# Patient Record
Sex: Male | Born: 1970
Health system: Southern US, Community
[De-identification: ages and names within clinical notes are randomized; demographics above are authoritative.]

---

## 1997-08-12 HISTORY — PX: VASECTOMY: SHX75

## 2007-06-16 ENCOUNTER — Ambulatory Visit: Payer: Self-pay | Admitting: General Surgery

## 2007-08-13 HISTORY — PX: HERNIA REPAIR: SHX51

## 2015-11-15 ENCOUNTER — Ambulatory Visit: Payer: Self-pay | Admitting: Primary Care

## 2015-11-21 ENCOUNTER — Ambulatory Visit (INDEPENDENT_AMBULATORY_CARE_PROVIDER_SITE_OTHER): Payer: 59 | Admitting: Primary Care

## 2015-11-21 ENCOUNTER — Encounter: Payer: Self-pay | Admitting: Primary Care

## 2015-11-21 VITALS — BP 104/80 | HR 57 | Temp 97.8°F | Ht 72.0 in | Wt 216.4 lb

## 2015-11-21 DIAGNOSIS — Z0000 Encounter for general adult medical examination without abnormal findings: Secondary | ICD-10-CM

## 2015-11-21 LAB — COMPREHENSIVE METABOLIC PANEL
ALT: 37 U/L (ref 0–53)
AST: 24 U/L (ref 0–37)
Albumin: 4.5 g/dL (ref 3.5–5.2)
Alkaline Phosphatase: 46 U/L (ref 39–117)
BILIRUBIN TOTAL: 0.8 mg/dL (ref 0.2–1.2)
BUN: 17 mg/dL (ref 6–23)
CALCIUM: 9.6 mg/dL (ref 8.4–10.5)
CO2: 29 mEq/L (ref 19–32)
CREATININE: 1.32 mg/dL (ref 0.40–1.50)
Chloride: 107 mEq/L (ref 96–112)
GFR: 62.76 mL/min (ref >60.00)
Glucose, Bld: 96 mg/dL (ref 70–99)
Potassium: 4.2 mEq/L (ref 3.5–5.1)
SODIUM: 140 meq/L (ref 135–145)
TOTAL PROTEIN: 7.3 g/dL (ref 6.0–8.3)

## 2015-11-21 LAB — CBC
HCT: 42.7 % (ref 39.0–52.0)
Hemoglobin: 14.6 g/dL (ref 13.0–17.0)
MCHC: 34.2 g/dL (ref 30.0–36.0)
MCV: 91.4 fl (ref 78.0–100.0)
Platelets: 197 10*3/uL (ref 150.0–400.0)
RBC: 4.67 Mil/uL (ref 4.22–5.81)
RDW: 12.8 % (ref 11.5–15.5)
WBC: 7.2 10*3/uL (ref 4.0–10.5)

## 2015-11-21 LAB — LIPID PANEL
CHOLESTEROL: 181 mg/dL (ref 0–200)
HDL: 35.7 mg/dL — ABNORMAL LOW (ref >39.00)
LDL Cholesterol: 126 mg/dL — ABNORMAL HIGH (ref 0–99)
NONHDL: 145.75
Total CHOL/HDL Ratio: 5
Triglycerides: 99 mg/dL (ref 0.0–149.0)
VLDL: 19.8 mg/dL (ref 0.0–40.0)

## 2015-11-21 LAB — HEMOGLOBIN A1C: HEMOGLOBIN A1C: 5.7 % (ref 4.6–6.5)

## 2015-11-21 NOTE — Progress Notes (Signed)
Subjective:    Patient ID: Eric Bolton, male    DOB: 24-Feb-1971, 45 y.o.   MRN: [id number]  HPI  [name] is a 45 year old male who presents today to establish care and for complete physical. He has forms to complete for an occupational physical including lab work.   Immunizations: -Tetanus: Unsure, believes he had a tetanus vaccination 2-3 years ago.  -Influenza: Did receive last season.    Diet: Endorses a fair. Breakfast: Skips  Lunch: Vegetables, meats, salads Dinner: Fast food Snacks: None Desserts: None Beverages: Water, flavored water with caffeine   Exercise: He runs and will do weights 4 times weekly.  Eye exam: Completed in numerous years. Denies changes in vision. Dental exam: Completes annually    Review of Systems  Constitutional: Negative for unexpected weight change.  HENT: Negative for rhinorrhea.   Respiratory: Negative for cough and shortness of breath.   Cardiovascular: Negative for chest pain.  Gastrointestinal: Negative for diarrhea and constipation.  Genitourinary: Negative for difficulty urinating.  Musculoskeletal: Negative for myalgias and arthralgias.  Skin: Negative for rash.  Allergic/Immunologic: Negative for environmental allergies.  Neurological: Negative for dizziness, numbness and headaches.  Psychiatric/Behavioral:       Denies concerns for anxiety or depression       History reviewed. No pertinent past medical history.  Social History   Social History  . Marital Status: Married    Spouse Name: N/A  . Number of Children: N/A  . Years of Education: N/A   Occupational History  . Not on file.   Social History Main Topics  . Smoking status: Never Smoker   . Smokeless tobacco: Not on file  . Alcohol Use: Yes  . Drug Use: Not on file  . Sexual Activity: Not on file   Other Topics Concern  . Not on file   Social History Narrative   Married.   3 children.    Works as a MidwifeDeputy Sheriff   Enjoys sports     Past Surgical History  Procedure Laterality Date  . Hernia repair  2009  . Vasectomy  1999    History reviewed. No pertinent family history.  No Known Allergies  No current outpatient prescriptions on file prior to visit.   No current facility-administered medications on file prior to visit.    BP 104/80 mmHg  Pulse 57  Temp(Src) 97.8 F (36.6 C)  Ht 6' (1.829 m)  Wt 216 lb 6.4 oz (98.158 kg)  BMI 29.34 kg/m2  SpO2 97%    Objective:   Physical Exam  Constitutional: He is oriented to person, place, and time. He appears well-nourished.  HENT:  Right Ear: Tympanic membrane and ear canal normal.  Left Ear: Tympanic membrane and ear canal normal.  Nose: Nose normal. Right sinus exhibits no maxillary sinus tenderness and no frontal sinus tenderness. Left sinus exhibits no maxillary sinus tenderness and no frontal sinus tenderness.  Mouth/Throat: Oropharynx is clear and moist.  Eyes: Conjunctivae and EOM are normal. Pupils are equal, round, and reactive to light.  Neck: Neck supple. Carotid bruit is not present. No thyromegaly present.  Cardiovascular: Normal rate, regular rhythm and normal heart sounds.   Pulmonary/Chest: Effort normal and breath sounds normal. He has no wheezes. He has no rales.  Abdominal: Soft. Bowel sounds are normal. There is no tenderness.  Musculoskeletal: Normal range of motion.  Neurological: He is alert and oriented to person, place, and time. He has normal reflexes. No cranial nerve deficit.  Skin: Skin is warm and dry.  Psychiatric: He has a normal mood and affect.          Assessment & Plan:

## 2015-11-21 NOTE — Progress Notes (Signed)
Pre visit review using our clinic review tool, if applicable. No additional management support is needed unless otherwise documented below in the visit note. 

## 2015-11-21 NOTE — Patient Instructions (Signed)
Complete lab work prior to leaving today. I will notify you of your results once received.   Work to [employer] Financialimprove your diet by limiting fast foods, fried foods, fatty foods.   Limit consumption of caffeine.  Ensure you are drinking 64 ounces of water daily.   I recommend an annual eye examination.  Follow up in 1 year for repeat physical or sooner of needed.  It was a pleasure to meet you today! Please don't hesitate to call me with any questions. Welcome to [facility]!

## 2015-11-21 NOTE — Assessment & Plan Note (Signed)
Td and Flu UTD. Discussed the importance of a healthy diet and regular exercise in order for weight loss and to reduce risk of other medical diseases. He does exercise 4 days weekly, commended him on this. Exam unremarkable. Labs pending.  Follow up in 1 year for repeat physical.

## 2015-11-22 ENCOUNTER — Encounter: Payer: Self-pay | Admitting: *Deleted

## 2016-09-28 ENCOUNTER — Emergency Department (HOSPITAL_COMMUNITY): Payer: 59

## 2016-09-28 ENCOUNTER — Encounter (HOSPITAL_COMMUNITY): Payer: Self-pay

## 2016-09-28 ENCOUNTER — Emergency Department (HOSPITAL_COMMUNITY)
Admission: EM | Admit: 2016-09-28 | Discharge: 2016-09-29 | Disposition: A | Discharge status: Home / Self Care | Payer: 59 | Attending: Emergency Medicine | Admitting: Emergency Medicine

## 2016-09-28 DIAGNOSIS — S0101XA Laceration without foreign body of scalp, initial encounter: Secondary | ICD-10-CM | POA: Insufficient documentation

## 2016-09-28 DIAGNOSIS — S0990XA Unspecified injury of head, initial encounter: Secondary | ICD-10-CM | POA: Diagnosis present

## 2016-09-28 DIAGNOSIS — W108XXA Fall (on) (from) other stairs and steps, initial encounter: Secondary | ICD-10-CM | POA: Diagnosis not present

## 2016-09-28 DIAGNOSIS — Y929 Unspecified place or not applicable: Secondary | ICD-10-CM | POA: Insufficient documentation

## 2016-09-28 DIAGNOSIS — Y999 Unspecified external cause status: Secondary | ICD-10-CM | POA: Diagnosis not present

## 2016-09-28 DIAGNOSIS — F10120 Alcohol abuse with intoxication, uncomplicated: Secondary | ICD-10-CM | POA: Diagnosis not present

## 2016-09-28 DIAGNOSIS — Y939 Activity, unspecified: Secondary | ICD-10-CM | POA: Insufficient documentation

## 2016-09-28 DIAGNOSIS — F10920 Alcohol use, unspecified with intoxication, uncomplicated: Secondary | ICD-10-CM

## 2016-09-28 LAB — CBC WITH DIFFERENTIAL/PLATELET
Basophils Absolute: 0 10*3/uL (ref 0.0–0.1)
Basophils Relative: 0 %
Eosinophils Absolute: 0.1 10*3/uL (ref 0.0–0.7)
Eosinophils Relative: 1 %
HEMATOCRIT: 40.4 % (ref 39.0–52.0)
HEMOGLOBIN: 14.1 g/dL (ref 13.0–17.0)
LYMPHS ABS: 5.2 10*3/uL — AB (ref 0.7–4.0)
LYMPHS PCT: 51 %
MCH: 31 pg (ref 26.0–34.0)
MCHC: 34.9 g/dL (ref 30.0–36.0)
MCV: 88.8 fL (ref 78.0–100.0)
MONOS PCT: 9 %
Monocytes Absolute: 0.9 10*3/uL (ref 0.1–1.0)
NEUTROS PCT: 39 %
Neutro Abs: 4 10*3/uL (ref 1.7–7.7)
Platelets: 197 10*3/uL (ref 150–400)
RBC: 4.55 MIL/uL (ref 4.22–5.81)
RDW: 12.4 % (ref 11.5–15.5)
WBC: 10.1 10*3/uL (ref 4.0–10.5)

## 2016-09-28 LAB — BASIC METABOLIC PANEL
Anion gap: 14 (ref 5–15)
BUN: 14 mg/dL (ref 6–20)
CHLORIDE: 104 mmol/L (ref 101–111)
CO2: 21 mmol/L — AB (ref 22–32)
CREATININE: 1.41 mg/dL — AB (ref 0.61–1.24)
Calcium: 9 mg/dL (ref 8.9–10.3)
GFR calc Af Amer: >60 mL/min (ref >60)
GFR calc non Af Amer: 59 mL/min — ABNORMAL LOW (ref >60)
GLUCOSE: 117 mg/dL — AB (ref 65–99)
POTASSIUM: 3.4 mmol/L — AB (ref 3.5–5.1)
Sodium: 139 mmol/L (ref 135–145)

## 2016-09-28 LAB — ETHANOL: Alcohol, Ethyl (B): 228 mg/dL — ABNORMAL HIGH (ref <5)

## 2016-09-28 NOTE — ED Provider Notes (Signed)
[facility]-EMERGENCY DEPT Provider Note   CSN: [id number] Arrival date & time: 09/28/16  2237     History   Chief Complaint Chief Complaint  Patient presents with  . Fall  . Head Injury    HPI Eric Bolton is a 46 y.o. male.  He fell down about 10 steps suffering a laceration to his scalp. He denies loss of consciousness. He has been drinking heavily tonight. He denies other injury. There's been no nausea or vomiting. He was ambulatory at the scene. 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   The history is provided by the patient.    History reviewed. No pertinent past medical history.  Patient Active Problem List   Diagnosis Date Noted  . Preventative health care 11/21/2015    Past Surgical History:  Procedure Laterality Date  . HERNIA REPAIR  2009  . VASECTOMY  1999       Home Medications    Prior to Admission medications   Not on File    Family History History reviewed. No pertinent family history.  Social History Social History  Substance Use Topics  . Smoking status: Never Smoker  . Smokeless tobacco: Never Used  . Alcohol use Yes     Comment: once a week     Allergies   Patient has no known allergies.   Review of Systems Review of Systems  All other systems reviewed and are negative.    Physical Exam Updated Vital Signs BP 114/82 (BP Location: Right Arm)   Pulse 68   Temp 98.2 F (36.8 C) (Oral)   Resp 18   Ht 6\' 2"  (1.88 m)   Wt 210 lb (95.3 kg)   SpO2 95%   BMI 26.96 kg/m   Physical Exam  Nursing note and vitals reviewed.  [age] year old male, resting comfortably and in no acute distress. Vital signs are normal. Oxygen saturation is 95%, which is normal. Head is normocephalic. Large laceration present at the vertex of the scalp on the right side. PERRLA, EOMI. Oropharynx is clear. Neck is immobilized in a stiff cervical collar. Back is nontender and there  is no CVA tenderness. Lungs are clear without rales, wheezes, or rhonchi. Chest is nontender. Heart has regular rate and rhythm without murmur. Abdomen is soft, flat, nontender without masses or hepatosplenomegaly and peristalsis is normoactive. Extremities have no cyanosis or edema, full range of motion is present. Skin is warm and dry without rash. Neurologic: Mental status is normal, cranial nerves are intact, there are no motor or sensory deficits.  ED Treatments / Results  Labs (all labs ordered are listed, but only abnormal results are displayed) Labs Reviewed  CBC WITH DIFFERENTIAL/PLATELET - Abnormal; Notable for the following:       Result Value   Lymphs Abs 5.2 (*)    All other components within normal limits  BASIC METABOLIC PANEL - Abnormal; Notable for the following:    Potassium 3.4 (*)    CO2 21 (*)    Glucose, Bld 117 (*)    Creatinine, Ser 1.41 (*)    GFR calc non Af Amer 59 (*)    All other components within normal limits  ETHANOL - Abnormal; Notable for the following:    Alcohol, Ethyl (B) 228 (*)    All other components within normal limits   Radiology Ct Head Wo Contrast  Result Date: 09/29/2016 CLINICAL DATA:  Fell 10 feet. Laceration to the  top of the skull at the parietal region. No loss of consciousness. Intoxicated. EXAM: CT HEAD WITHOUT CONTRAST CT CERVICAL SPINE WITHOUT CONTRAST TECHNIQUE: Multidetector CT imaging of the head and cervical spine was performed following the standard protocol without intravenous contrast. Multiplanar CT image reconstructions of the cervical spine were also generated. COMPARISON:  None. FINDINGS: CT HEAD FINDINGS Brain: No evidence of acute infarction, hemorrhage, hydrocephalus, extra-axial collection or mass lesion/mass effect. Vascular: No hyperdense vessel or unexpected calcification. Skull: Calvarium appears intact.  No depressed skull fractures. Sinuses/Orbits: No acute finding. Other: Soft tissue scalp laceration over the  right parietal region. Laceration extends to the bone surface. Associated subcutaneous emphysema. No large hematomas. CT CERVICAL SPINE FINDINGS Alignment: Normal alignment of the cervical vertebrae and facet joints. C1-2 articulation appears intact. Skull base and vertebrae: Skullbase is unremarkable. No vertebral compression deformities. No focal bone lesion or bone destruction. Bone cortex appears intact. Soft tissues and spinal canal: No prevertebral fluid or swelling. No visible canal hematoma. Disc levels: Mild degenerative changes at C5-6 with slight disc space narrowing and endplate hypertrophic changes. Upper chest: Negative. Other: None. IMPRESSION: No acute intracranial abnormalities. Subcutaneous scalp laceration over the right parietal region. Normal alignment of the cervical spine. Minimal degenerative changes. No acute displaced fractures identified. Electronically Signed   By: Burman Nieves M.D.   On: 09/29/2016 00:04   Ct Cervical Spine Wo Contrast  Result Date: 09/29/2016 CLINICAL DATA:  Fell 10 feet. Laceration to the top of the skull at the parietal region. No loss of consciousness. Intoxicated. EXAM: CT HEAD WITHOUT CONTRAST CT CERVICAL SPINE WITHOUT CONTRAST TECHNIQUE: Multidetector CT imaging of the head and cervical spine was performed following the standard protocol without intravenous contrast. Multiplanar CT image reconstructions of the cervical spine were also generated. COMPARISON:  None. FINDINGS: CT HEAD FINDINGS Brain: No evidence of acute infarction, hemorrhage, hydrocephalus, extra-axial collection or mass lesion/mass effect. Vascular: No hyperdense vessel or unexpected calcification. Skull: Calvarium appears intact.  No depressed skull fractures. Sinuses/Orbits: No acute finding. Other: Soft tissue scalp laceration over the right parietal region. Laceration extends to the bone surface. Associated subcutaneous emphysema. No large hematomas. CT CERVICAL SPINE FINDINGS  Alignment: Normal alignment of the cervical vertebrae and facet joints. C1-2 articulation appears intact. Skull base and vertebrae: Skullbase is unremarkable. No vertebral compression deformities. No focal bone lesion or bone destruction. Bone cortex appears intact. Soft tissues and spinal canal: No prevertebral fluid or swelling. No visible canal hematoma. Disc levels: Mild degenerative changes at C5-6 with slight disc space narrowing and endplate hypertrophic changes. Upper chest: Negative. Other: None. IMPRESSION: No acute intracranial abnormalities. Subcutaneous scalp laceration over the right parietal region. Normal alignment of the cervical spine. Minimal degenerative changes. No acute displaced fractures identified. Electronically Signed   By: Burman Nieves M.D.   On: 09/29/2016 00:04    Procedures Procedures (including critical care time) LACERATION REPAIR Performed by: UXLKG,[email or web address] Authorized by: VOZDG,UYQIH Consent: Verbal consent obtained. Risks and benefits: risks, benefits and alternatives were discussed Consent given by: patient Patient identity confirmed: provided demographic data Prepped and Draped in normal sterile fashion Wound explored  Laceration Location: Scalp  Laceration Length: 14 cm  No Foreign Bodies seen or palpated  Anesthesia:None  Amount of cleaning: standard  Skin closure: Close   Number of staples: 16  Technique: Surgical stapling   Patient tolerance: Patient tolerated the procedure well with no immediate complications.   Medications Ordered in ED Medications - No data to display   Initial  Impression / Assessment and Plan / ED Course  I have reviewed the triage vital signs and the nursing notes.  Pertinent labs & imaging results that were available during my care of the patient were reviewed by me and considered in my medical decision making (see chart for details).  Fall with scalp laceration. He is being sent for CT scans of head and  cervical spine. He will need staple closure of laceration.  CT of head and cervical spine show no intracranial injury and no bony injury. Laceration is closed and he is discharged. Of note, ethanol level is significantly elevated at 228.  Final Clinical Impressions(s) / ED Diagnoses   Final diagnoses:  Fall down stairs, initial encounter  Alcohol intoxication, uncomplicated (HCC)  Scalp laceration, initial encounter    New Prescriptions New Prescriptions   No medications on file     Dione Booze, MD 09/29/16 [phone]

## 2016-09-28 NOTE — ED Triage Notes (Signed)
Pt from home, pt was walking down steps and fell 10 feet and hit head. Pt has deep laceration to right side of the top of the crown. Pt alert and oriented x 4. Admits to etoh use this evening. PERRL. Pt denies pain.

## 2016-09-29 NOTE — ED Notes (Signed)
Pt and family understood dc material. NAD Noted 

## 2016-10-07 ENCOUNTER — Encounter: Payer: Self-pay | Admitting: Primary Care

## 2016-10-07 ENCOUNTER — Ambulatory Visit (INDEPENDENT_AMBULATORY_CARE_PROVIDER_SITE_OTHER): Payer: 59 | Admitting: Primary Care

## 2016-10-07 VITALS — BP 116/80 | HR 63 | Temp 97.6°F | Ht 74.0 in | Wt 214.8 lb

## 2016-10-07 DIAGNOSIS — Z4802 Encounter for removal of sutures: Secondary | ICD-10-CM

## 2016-10-07 NOTE — Patient Instructions (Signed)
Try Mederma cream to prevent scaring. You may also try coconut oil.  Continue to keep the site clean.   Please call me if your incision becomes very red, painful, and starts oozing.  It was a pleasure to see you today!

## 2016-10-07 NOTE — Progress Notes (Signed)
Pre visit review using our clinic review tool, if applicable. No additional management support is needed unless otherwise documented below in the visit note. 

## 2016-10-07 NOTE — Progress Notes (Signed)
   Subjective:    Patient ID: Eric Bolton, male    DOB: 12/02/1970, 46 y.o.   MRN: [id number]  HPI  [name] is a 46 year old male who presents today for staple removal.   He presented to the emergency department on 09/28/16 with a chief complaint of laceration. He fell down 10 steps suffering a laceration to his scalp. During his ED stay he underwent CT of his head and cervical spine which did not show any acute intercranial abnormalities.   During his stay in the ED he had 16 stables placed to his right scalp. He denies fevers, erythema, pain, drainage. He's keeping his wound clean and has been applying Neosporin ointment. He is here today to have staples removed.  Review of Systems  Constitutional: Negative for chills and fever.  Skin: Positive for wound. Negative for color change.       No past medical history on file.   Social History   Social History  . Marital status: Married    Spouse name: N/A  . Number of children: N/A  . Years of education: N/A   Occupational History  . Not on file.   Social History Main Topics  . Smoking status: Never Smoker  . Smokeless tobacco: Never Used  . Alcohol use Yes     Comment: once a week  . Drug use: No  . Sexual activity: Not on file   Other Topics Concern  . Not on file   Social History Narrative   Married.   3 children.    Works as a MidwifeDeputy Sheriff   Enjoys sports    Past Surgical History:  Procedure Laterality Date  . HERNIA REPAIR  2009  . VASECTOMY  1999    No family history on file.  No Known Allergies  No current outpatient prescriptions on file prior to visit.   No current facility-administered medications on file prior to visit.     BP 116/80   Pulse 63   Temp 97.6 F (36.4 C) (Oral)   Ht 6\' 2"  (1.88 m)   Wt 214 lb 12.8 oz (97.4 kg)   SpO2 96%   BMI 27.58 kg/m    Objective:   Physical Exam  Neck: Neck supple.  Cardiovascular: Normal rate.   Pulmonary/Chest: Effort normal.    Skin: Skin is warm and dry.  14 cm laceration to right scalp, scabbing, no drainage, no erythema, non tender.          Assessment & Plan:  Staple Removal:  Presents today for staple removal. 12 days since staples were applied. Site cleansed. 16 staples removed from closed laceration to right head. Applied bacitracin ointment to site. No s/s of acute infection. Patient tolerated well. Home care instructions discussed.  Morrie Sheldonlark,Katherine Kendal, NP

## 2016-11-18 ENCOUNTER — Other Ambulatory Visit (INDEPENDENT_AMBULATORY_CARE_PROVIDER_SITE_OTHER): Payer: 59

## 2016-11-18 ENCOUNTER — Other Ambulatory Visit: Payer: Self-pay | Admitting: Primary Care

## 2016-11-18 DIAGNOSIS — R7303 Prediabetes: Secondary | ICD-10-CM | POA: Diagnosis not present

## 2016-11-18 DIAGNOSIS — E785 Hyperlipidemia, unspecified: Secondary | ICD-10-CM

## 2016-11-18 LAB — LIPID PANEL
CHOL/HDL RATIO: 5
CHOLESTEROL: 200 mg/dL (ref 0–200)
HDL: 39 mg/dL — ABNORMAL LOW (ref >39.00)
NonHDL: 161.14
Triglycerides: 212 mg/dL — ABNORMAL HIGH (ref 0.0–149.0)
VLDL: 42.4 mg/dL — ABNORMAL HIGH (ref 0.0–40.0)

## 2016-11-18 LAB — COMPREHENSIVE METABOLIC PANEL
ALBUMIN: 4.6 g/dL (ref 3.5–5.2)
ALT: 26 U/L (ref 0–53)
AST: 23 U/L (ref 0–37)
Alkaline Phosphatase: 52 U/L (ref 39–117)
BILIRUBIN TOTAL: 0.6 mg/dL (ref 0.2–1.2)
BUN: 31 mg/dL — AB (ref 6–23)
CALCIUM: 9.9 mg/dL (ref 8.4–10.5)
CO2: 30 mEq/L (ref 19–32)
CREATININE: 1.45 mg/dL (ref 0.40–1.50)
Chloride: 103 mEq/L (ref 96–112)
GFR: 56.05 mL/min — ABNORMAL LOW (ref >60.00)
Glucose, Bld: 80 mg/dL (ref 70–99)
Potassium: 4.1 mEq/L (ref 3.5–5.1)
SODIUM: 139 meq/L (ref 135–145)
Total Protein: 7.2 g/dL (ref 6.0–8.3)

## 2016-11-18 LAB — HEMOGLOBIN A1C: HEMOGLOBIN A1C: 5.4 % (ref 4.6–6.5)

## 2016-11-18 LAB — LDL CHOLESTEROL, DIRECT: LDL DIRECT: 131 mg/dL

## 2016-11-21 ENCOUNTER — Encounter: Payer: Self-pay | Admitting: Primary Care

## 2016-11-21 ENCOUNTER — Encounter (INDEPENDENT_AMBULATORY_CARE_PROVIDER_SITE_OTHER): Payer: Self-pay

## 2016-11-21 ENCOUNTER — Ambulatory Visit (INDEPENDENT_AMBULATORY_CARE_PROVIDER_SITE_OTHER): Payer: 59 | Admitting: Primary Care

## 2016-11-21 VITALS — BP 116/78 | HR 50 | Temp 98.2°F | Ht 74.0 in | Wt 211.8 lb

## 2016-11-21 DIAGNOSIS — E785 Hyperlipidemia, unspecified: Secondary | ICD-10-CM | POA: Diagnosis not present

## 2016-11-21 DIAGNOSIS — N289 Disorder of kidney and ureter, unspecified: Secondary | ICD-10-CM | POA: Diagnosis not present

## 2016-11-21 DIAGNOSIS — Z0000 Encounter for general adult medical examination without abnormal findings: Secondary | ICD-10-CM

## 2016-11-21 NOTE — Assessment & Plan Note (Signed)
Slight decrease in GFR. Discussed to decrease caffeine products and increase water consumption. Recheck in 6 months.

## 2016-11-21 NOTE — Patient Instructions (Signed)
Your cholesterol is slightly elevated when compared to last year. Increase consumption of vegetables, fruit, whole grains.   Continue exercising. You should be getting 150 minutes of moderate intensity exercise weekly.  Your kidney function is slightly declined. This is stable when compared to last year. Reduce caffeine products and avoid medications like Ibuprofen, Aleve, etc.  Schedule a lab only appointment in 6 months for re-evaluation of your cholesterol and kidney function. Ensure you come fasting 4 hours prior to this appointment.   Follow up in 1 year for your annual exam or sooner if needed.  It was a pleasure to see you today!

## 2016-11-21 NOTE — Progress Notes (Signed)
Pre visit review using our clinic review tool, if applicable. No additional management support is needed unless otherwise documented below in the visit note. 

## 2016-11-21 NOTE — Progress Notes (Signed)
Subjective:    Patient ID: Nyra Jabs, male    DOB: [birth year], 46 y.o.   MRN: [id number]  HPI  Mr. Grassia is a 46 year old male who presents today for complete physical.  Immunizations: -Tetanus: Completed 3-4 years ago.  -Influenza: Did not completed last season.   Diet: He endorses a healthy diet that he started in January 2018 Breakfast: Skips Lunch: Tuna, peanut butter sandwich  Dinner: Chicken, fruit, peanut butter Snacks: Boiled eggs Desserts: None Beverages: Water, energy packets, pre-work out mix  Exercise: Runs and lifts weights Eye exam: Completed several years ago Dental exam: Completes annually   Review of Systems  Constitutional: Negative for unexpected weight change.  HENT: Negative for rhinorrhea.   Respiratory: Negative for cough and shortness of breath.   Cardiovascular: Negative for chest pain.  Gastrointestinal: Negative for constipation and diarrhea.  Genitourinary: Negative for difficulty urinating.  Musculoskeletal: Negative for arthralgias and myalgias.  Skin: Negative for rash.  Allergic/Immunologic: Negative for environmental allergies.  Neurological: Negative for dizziness, numbness and headaches.  Psychiatric/Behavioral:       Denies concerns for anxiety or depression       No past medical history on file.   Social History   Social History  . Marital status: Married    Spouse name: N/A  . Number of children: N/A  . Years of education: N/A   Occupational History  . Not on file.   Social History Main Topics  . Smoking status: Never Smoker  . Smokeless tobacco: Never Used  . Alcohol use Yes     Comment: once a week  . Drug use: No  . Sexual activity: Not on file   Other Topics Concern  . Not on file   Social History Narrative   Married.   3 children.    Works as a Midwife   Enjoys sports    Past Surgical History:  Procedure Laterality Date  . HERNIA REPAIR  2009  . VASECTOMY  1999    No family  history on file.  No Known Allergies  No current outpatient prescriptions on file prior to visit.   No current facility-administered medications on file prior to visit.     BP 116/78   Pulse (!) 50   Temp 98.2 F (36.8 C) (Oral)   Ht  (1.88 m)   Wt 211 lb 12.8 oz (96.1 kg)   SpO2 98%   BMI 27.19 kg/m    Objective:   Physical Exam  Constitutional: He is oriented to person, place, and time. He appears well-nourished.  HENT:  Right Ear: Tympanic membrane and ear canal normal.  Left Ear: Tympanic membrane and ear canal normal.  Nose: Nose normal. Right sinus exhibits no maxillary sinus tenderness and no frontal sinus tenderness. Left sinus exhibits no maxillary sinus tenderness and no frontal sinus tenderness.  Mouth/Throat: Oropharynx is clear and moist.  Eyes: Conjunctivae and EOM are normal. Pupils are equal, round, and reactive to light.  Neck: Neck supple. Carotid bruit is not present. No thyromegaly present.  Cardiovascular: Normal rate, regular rhythm and normal heart sounds.   Pulmonary/Chest: Effort normal and breath sounds normal. He has no wheezes. He has no rales.  Abdominal: Soft. Bowel sounds are normal. There is no tenderness.  Musculoskeletal: Normal range of motion.  Neurological: He is alert and oriented to person, place, and time. He has normal reflexes. No cranial nerve deficit.  Skin: Skin is warm and dry.  Well healing  scar to right cranium  Psychiatric: He has a normal mood and affect.          Assessment & Plan:

## 2016-11-21 NOTE — Assessment & Plan Note (Signed)
Immunizations UTD. Commended him on regular exercise and fair diet. Discussed to reduce caffeine products and increase vegetables, fruit, whole grains. Exam unremarkable. Labs with decreased renal function and hyperlipidemia. Repeat labs in 6 months. Follow up in 1 year for annual exam.

## 2016-11-21 NOTE — Assessment & Plan Note (Signed)
TC borderline, Trigs and LDL above goal. Discussed to increase vegetables, fruit, whole grains. Continue regular exercise. Recheck Lipids in 6 months.

## 2017-05-14 ENCOUNTER — Other Ambulatory Visit: Payer: Self-pay | Admitting: Primary Care

## 2017-05-14 DIAGNOSIS — E785 Hyperlipidemia, unspecified: Secondary | ICD-10-CM

## 2017-05-14 DIAGNOSIS — N289 Disorder of kidney and ureter, unspecified: Secondary | ICD-10-CM

## 2017-05-23 ENCOUNTER — Other Ambulatory Visit: Payer: 59

## 2017-11-13 ENCOUNTER — Other Ambulatory Visit: Payer: Self-pay | Admitting: Primary Care

## 2017-11-13 DIAGNOSIS — E782 Mixed hyperlipidemia: Secondary | ICD-10-CM

## 2017-11-20 ENCOUNTER — Other Ambulatory Visit: Payer: Self-pay

## 2017-11-27 ENCOUNTER — Encounter: Payer: 59 | Admitting: Primary Care

## 2018-06-11 ENCOUNTER — Emergency Department (HOSPITAL_COMMUNITY): Admission: EM | Admit: 2018-06-11 | Discharge: 2018-06-11 | Discharge status: Absent without leave | Payer: Self-pay

## 2018-06-17 DIAGNOSIS — M7711 Lateral epicondylitis, right elbow: Secondary | ICD-10-CM | POA: Diagnosis not present

## 2018-08-12 DIAGNOSIS — I82409 Acute embolism and thrombosis of unspecified deep veins of unspecified lower extremity: Secondary | ICD-10-CM

## 2018-08-12 HISTORY — DX: Acute embolism and thrombosis of unspecified deep veins of unspecified lower extremity: I82.409

## 2018-08-20 DIAGNOSIS — Z713 Dietary counseling and surveillance: Secondary | ICD-10-CM | POA: Diagnosis not present

## 2019-02-16 DIAGNOSIS — Z713 Dietary counseling and surveillance: Secondary | ICD-10-CM | POA: Diagnosis not present

## 2019-04-13 DIAGNOSIS — Z713 Dietary counseling and surveillance: Secondary | ICD-10-CM | POA: Diagnosis not present

## 2019-04-26 DIAGNOSIS — M25571 Pain in right ankle and joints of right foot: Secondary | ICD-10-CM | POA: Diagnosis not present

## 2019-05-17 ENCOUNTER — Other Ambulatory Visit: Payer: Self-pay

## 2019-05-17 ENCOUNTER — Ambulatory Visit: Payer: BC Managed Care – PPO | Admitting: Primary Care

## 2019-05-17 ENCOUNTER — Encounter: Payer: Self-pay | Admitting: Primary Care

## 2019-05-17 DIAGNOSIS — R600 Localized edema: Secondary | ICD-10-CM | POA: Insufficient documentation

## 2019-05-17 DIAGNOSIS — M25473 Effusion, unspecified ankle: Secondary | ICD-10-CM | POA: Diagnosis not present

## 2019-05-17 LAB — COMPREHENSIVE METABOLIC PANEL
ALT: 38 U/L (ref 0–53)
AST: 28 U/L (ref 0–37)
Albumin: 4.7 g/dL (ref 3.5–5.2)
Alkaline Phosphatase: 55 U/L (ref 39–117)
BUN: 22 mg/dL (ref 6–23)
CO2: 27 mEq/L (ref 19–32)
Calcium: 9.8 mg/dL (ref 8.4–10.5)
Chloride: 104 mEq/L (ref 96–112)
Creatinine, Ser: 1.28 mg/dL (ref 0.40–1.50)
GFR: 60.23 mL/min (ref >60.00)
Glucose, Bld: 90 mg/dL (ref 70–99)
Potassium: 4.5 mEq/L (ref 3.5–5.1)
Sodium: 137 mEq/L (ref 135–145)
Total Bilirubin: 0.7 mg/dL (ref 0.2–1.2)
Total Protein: 7.3 g/dL (ref 6.0–8.3)

## 2019-05-17 LAB — CBC
HCT: 42.3 % (ref 39.0–52.0)
Hemoglobin: 14.3 g/dL (ref 13.0–17.0)
MCHC: 33.8 g/dL (ref 30.0–36.0)
MCV: 92.4 fl (ref 78.0–100.0)
Platelets: 203 10*3/uL (ref 150.0–400.0)
RBC: 4.58 Mil/uL (ref 4.22–5.81)
RDW: 13.2 % (ref 11.5–15.5)
WBC: 6.6 10*3/uL (ref 4.0–10.5)

## 2019-05-17 LAB — URIC ACID: Uric Acid, Serum: 7.2 mg/dL (ref 4.0–7.8)

## 2019-05-17 NOTE — Assessment & Plan Note (Signed)
Diagnosed with gout per orthopedics, no labs completed.  Presentation and HPI today doesn't fit clinical picture of gout, but will check uric acid level today.  Suspect edema is secondary to constant walking during five days weekly with constant sitting two days weekly. Suspect pain is secondary to swelling.   Check labs today including uric acid, CMP, CBC. Recommended he purchase compression socks for work.   Await results.

## 2019-05-17 NOTE — Progress Notes (Signed)
Subjective:    Patient ID: Eric Bolton, male    DOB: [birth year], 48 y.o.   MRN: [id number]  HPI  Eric Bolton is a 48 year old male with a history of hyperlipidemia, decreased renal function who presents today with a chief complaint of foot swelling.   About one month ago he traveled to [place] and on his way back he noticed swelling to his right ankle. During that trip he ate a lot of shellfish and drank a lot of beer. Over the last 2-3 weeks he's noticed his usual swelling with pain.   He presented to [facility] several weeks ago and was diagnosed with gout and was treated with Mitagatree with temporary improvement but without resolve to swelling and pain. He did undergo xrays and was told that everything looked normal. Since treated he's continued to take his Mitagare twice daily and continues to notice pain.   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 Also walks 10 miles daily five days weekly at work. He does notice significant improvement in swelling and pain when waking in the morning, will progress during the day.  He denies injury/trauma, color changes.   BP Readings from Last 3 Encounters:  05/17/19 122/78  11/21/16 116/78  10/07/16 116/80     Review of Systems  Eyes: Negative for visual disturbance.  Respiratory: Negative for shortness of breath.   Cardiovascular: Positive for leg swelling. Negative for chest pain.  Musculoskeletal: Positive for arthralgias.       No past medical history on file.   Social History   Socioeconomic History  . Marital status: Married    Spouse name: Not on file  . Number of children: Not on file  . Years of education: Not on file  . Highest education level: Not on file  Occupational History  . Not on file  Social Needs  . Financial resource strain: Not on file  . Food insecurity    Worry: Not on file    Inability: Not on file  . Transportation needs    Medical: Not on file   Non-medical: Not on file  Tobacco Use  . Smoking status: Never Smoker  . Smokeless tobacco: Never Used  Substance and Sexual Activity  . Alcohol use: Yes    Comment: once a week  . Drug use: No  . Sexual activity: Not on file  Lifestyle  . Physical activity    Days per week: Not on file    Minutes per session: Not on file  . Stress: Not on file  Relationships  . Social Musician on phone: Not on file    Gets together: Not on file    Attends religious service: Not on file    Active member of club or organization: Not on file    Attends meetings of clubs or organizations: Not on file    Relationship status: Not on file  . Intimate partner violence    Fear of current or ex partner: Not on file    Emotionally abused: Not on file    Physically abused: Not on file    Forced sexual activity: Not on file  Other Topics Concern  . Not on file  Social History Narrative   Married.   3 children.    Works as a Midwife   Enjoys sports    Past Surgical History:  Procedure Laterality Date  . HERNIA REPAIR  2009  . VASECTOMY  1999    No family history on file.  No Known Allergies  Current Outpatient Medications on File Prior to Visit  Medication Sig Dispense Refill  . MITIGARE 0.6 MG CAPS Take 1 capsule by mouth 2 (two) times daily.      No current facility-administered medications on file prior to visit.     BP 122/78   Pulse (!) 55   Temp 97.7 F (36.5 C) (Temporal)   Ht 6\' 2"  (1.88 m)   Wt 213 lb 12 oz (97 kg)   SpO2 97%   BMI 27.44 kg/m    Objective:   Physical Exam  Constitutional: He appears well-nourished.  Neck: Neck supple.  Cardiovascular: Normal rate and regular rhythm.  Pulses:      Dorsalis pedis pulses are 2+ on the right side.       Posterior tibial pulses are 2+ on the right side.  Mild edema noted to right ankle, no pitting or erythema.  Left ankle benign.   Respiratory: Effort normal and breath sounds normal.  Skin: Skin is  warm and dry.  Psychiatric: He has a normal mood and affect.           Assessment & Plan:

## 2019-05-17 NOTE — Patient Instructions (Signed)
Stop by the lab prior to leaving today. I will notify you of your results once received.   Elevate your legs when resting.  Purchase compression socks to wear during the day.  Ensure you are consuming 64 ounces of water daily.  It was a pleasure to see you today!

## 2019-05-18 ENCOUNTER — Telehealth: Payer: Self-pay | Admitting: Primary Care

## 2019-05-18 NOTE — Telephone Encounter (Signed)
Patient called to get his lab results.  Patient said if he's not available, a detailed message can be left on voice mail.

## 2019-05-19 ENCOUNTER — Other Ambulatory Visit: Payer: Self-pay | Admitting: Primary Care

## 2019-05-19 DIAGNOSIS — M109 Gout, unspecified: Secondary | ICD-10-CM

## 2019-05-19 MED ORDER — PREDNISONE 20 MG PO TABS
ORAL_TABLET | ORAL | 0 refills | Status: DC
Start: 2019-05-19 — End: 2019-12-06

## 2019-05-19 NOTE — Telephone Encounter (Signed)
Called and left detailed message on patient's number. Awaiting his call back. See result note

## 2019-07-14 DIAGNOSIS — Z713 Dietary counseling and surveillance: Secondary | ICD-10-CM | POA: Diagnosis not present

## 2019-12-01 ENCOUNTER — Telehealth: Payer: Self-pay

## 2019-12-01 NOTE — Telephone Encounter (Signed)
Pt said has had swelling in rt lower leg, ankle and foot since last summer; pt was seen last summer at [facility] and dx as gout; pt said the symptoms have never cleared but now the swelling has worsened and there is redness at ankle and lower leg.compression socks seem to help the swelling when worn. On and off rt foot has numbness. No pain,warmth or weakness in rt lower leg. No CP or SOB.Rt lower leg swelling goes down over night. Pt has a brother right now with DVT and pts father has hx of DVT. 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 Pt said he cannot get off work to come to office. Offered several appts for pt and he could not schedule due to work schedule. Pt is already scheduled to see Allayne Gitelman NP on 12/06/19 at 7:20 AM. ED precautions given and pt voiced understanding. Pt has no covid symptoms, no travel and no known exposure to + covid. FYI to Allayne Gitelman NP.

## 2019-12-01 NOTE — Telephone Encounter (Signed)
Noted, will evaluate,

## 2019-12-06 ENCOUNTER — Ambulatory Visit (INDEPENDENT_AMBULATORY_CARE_PROVIDER_SITE_OTHER): Payer: 59 | Admitting: Primary Care

## 2019-12-06 ENCOUNTER — Other Ambulatory Visit: Payer: Self-pay

## 2019-12-06 ENCOUNTER — Encounter: Payer: Self-pay | Admitting: Primary Care

## 2019-12-06 DIAGNOSIS — R600 Localized edema: Secondary | ICD-10-CM | POA: Diagnosis not present

## 2019-12-06 NOTE — Assessment & Plan Note (Signed)
Chronic and unchanged. Exam today overall benign.  He had labs drawn last week through his employer, he will drop these off later this week.  Doubt DVT, but given family history will order ultrasound. Referral placed to vascular services for evaluation given continued swelling.

## 2019-12-06 NOTE — Patient Instructions (Signed)
You will be contacted regarding your referral to vascular services and also for the ultrasound of your leg.  Please let us know if you have not been contacted within one week.   It was a pleasure to see you today!

## 2019-12-06 NOTE — Progress Notes (Signed)
Subjective:    Patient ID: Eric Bolton, male    DOB: 1971/08/06, 49 y.o.   MRN: [id number]  HPI  This visit occurred during the SARS-CoV-2 public health emergency.  Safety protocols were in place, including screening questions prior to the visit, additional usage of staff PPE, and extensive cleaning of exam room while observing appropriate contact time as indicated for disinfecting solutions.   Mr. Ziemann is a 49 year old male with a history of decreased renal function, gout, ankle edema, hyperlipidemia who presents today with a chief complaint of lower extremity edema.  Chronic for nearly one year. He continues to notice lower extremity edema that is located only to the right side, right ankle up through mid calf. His swelling only occurs during the day, improved in the morning when waking. He occasionally notices redness and tingling to the medial ankle. He is active at work most days weekly with walking, riding a bike, etc, but will sit for 10 hours at a time two days weekly.   Overall denies calf pain, joint pain, erythema, pain with ambulation and riding his bike. He doesn't take anything for his symptoms. Doesn't wear compression socks.   He has a family history of DVT in both his father and brother. He is unaware of any clotting disorders within his family history.  BP Readings from Last 3 Encounters:  12/06/19 124/80  05/17/19 122/78  11/21/16 116/78     Review of Systems  Eyes: Negative for visual disturbance.  Respiratory: Negative for shortness of breath.   Cardiovascular: Positive for leg swelling. Negative for chest pain.  Musculoskeletal: Negative for arthralgias.  Skin: Negative for color change.  Neurological: Negative for dizziness and headaches.       No past medical history on file.   Social History   Socioeconomic History  . Marital status: Married    Spouse name: Not on file  . Number of children: Not on file  . Years of education: Not on file    . Highest education level: Not on file  Occupational History  . Not on file  Tobacco Use  . Smoking status: Never Smoker  . Smokeless tobacco: Never Used  Substance and Sexual Activity  . Alcohol use: Yes    Comment: once a week  . Drug use: No  . Sexual activity: Not on file  Other Topics Concern  . Not on file  Social History Narrative   Married.   3 children.    Works as a Quarry manager   Enjoys sports   Social Determinants of Radio broadcast assistant Strain:   . Difficulty of Paying Living Expenses:   Food Insecurity:   . Worried About Charity fundraiser in the Last Year:   . Arboriculturist in the Last Year:   Transportation Needs:   . Film/video editor (Medical):   Marland Kitchen Lack of Transportation (Non-Medical):   Physical Activity:   . Days of Exercise per Week:   . Minutes of Exercise per Session:   Stress:   . Feeling of Stress :   Social Connections:   . Frequency of Communication with Friends and Family:   . Frequency of Social Gatherings with Friends and Family:   . Attends Religious Services:   . Active Member of Clubs or Organizations:   . Attends Archivist Meetings:   Marland Kitchen Marital Status:   Intimate Partner Violence:   . Fear of Current or Ex-Partner:   .  Emotionally Abused:   Marland Kitchen Physically Abused:   . Sexually Abused:     Past Surgical History:  Procedure Laterality Date  . HERNIA REPAIR  2009  . VASECTOMY  1999    No family history on file.  No Known Allergies  No current outpatient medications on file prior to visit.   No current facility-administered medications on file prior to visit.    BP 124/80   Pulse 62   Temp (!) 96.9 F (36.1 C) (Temporal)   Ht 6\' 2"  (1.88 m)   Wt 212 lb 8 oz (96.4 kg)   SpO2 98%   BMI 27.28 kg/m    Objective:   Physical Exam  Constitutional: He appears well-nourished.  Cardiovascular: Normal rate and regular rhythm.  Pulses:      Dorsalis pedis pulses are 2+ on the right side.        Posterior tibial pulses are 2+ on the right side.  Right lower extremity with slight swelling compared to left, no pitting. No ankle edema or erythema.  Respiratory: Effort normal and breath sounds normal.  Musculoskeletal:     Cervical back: Neck supple.  Skin: Skin is warm and dry. No erythema.  Psychiatric: He has a normal mood and affect.           Assessment & Plan:

## 2019-12-10 ENCOUNTER — Other Ambulatory Visit: Payer: Self-pay | Admitting: Primary Care

## 2019-12-10 ENCOUNTER — Other Ambulatory Visit: Payer: Self-pay

## 2019-12-10 ENCOUNTER — Telehealth: Payer: Self-pay

## 2019-12-10 ENCOUNTER — Ambulatory Visit (INDEPENDENT_AMBULATORY_CARE_PROVIDER_SITE_OTHER): Payer: 59

## 2019-12-10 DIAGNOSIS — I82551 Chronic embolism and thrombosis of right peroneal vein: Secondary | ICD-10-CM

## 2019-12-10 DIAGNOSIS — R600 Localized edema: Secondary | ICD-10-CM | POA: Diagnosis not present

## 2019-12-10 DIAGNOSIS — I82431 Acute embolism and thrombosis of right popliteal vein: Secondary | ICD-10-CM

## 2019-12-10 DIAGNOSIS — I82541 Chronic embolism and thrombosis of right tibial vein: Secondary | ICD-10-CM

## 2019-12-10 MED ORDER — APIXABAN 5 MG PO TABS: ORAL_TABLET | ORAL | 0 refills | Status: DC

## 2019-12-10 NOTE — Telephone Encounter (Addendum)
Preliminary report; + blood clot tibial veins and popliteal vein behind knee. Pt is waiting.taken to Allayne Gitelman NP. Jae Dire wanted to verify that the report would be in Epic today and Jan said yes it should and Jae Dire said to let pt know he can go home and someone will call pt later today; Jan will advise pt to go home and pt will get cb today.

## 2019-12-10 NOTE — Telephone Encounter (Signed)
Spoke with patient via phone regarding results. Prescription for Eliquis sent to pharmacy. He will see vascular services in 2 weeks.  See result note.

## 2019-12-11 DIAGNOSIS — I749 Embolism and thrombosis of unspecified artery: Secondary | ICD-10-CM

## 2019-12-11 HISTORY — DX: Embolism and thrombosis of unspecified artery: I74.9

## 2019-12-13 ENCOUNTER — Telehealth: Payer: Self-pay

## 2019-12-13 NOTE — Telephone Encounter (Signed)
Noted and appreciate the follow through. Thanks!

## 2019-12-13 NOTE — Telephone Encounter (Signed)
Contacted [facility] and spoke with pharmacist who reports eliquis is covered by insurance but it will be over $100. He reports all other blood thinners are brand name except warfarin.  Found a co-pay card for Eliquis online and signed up pt to get 30 day script for $10 for 24 months.  Contacted pt and advised of copay card being emailed to him and if any further problems to let the office know. Pt appreciative and verbalized understanding.

## 2019-12-13 NOTE — Telephone Encounter (Signed)
[facility] Night - Client TELEPHONE ADVICE RECORD [facility] Patient Name: Eric Bolton Gender: Male DOB: February 05, 1971 Age: 49 Y 7 M 4 D Return Phone Number: [phone] (Primary), [phone] (Secondary) Address[place] [address] Client [facility] Night - Client Client Site  [facility] - Night Physician Vernona Rieger - NP Contact Type Call Who Is Calling Patient / Member / Family / Caregiver Call Type Triage / Clinical Relationship To Patient Self Return Phone Number [phone] (Primary) Chief Complaint Prescription Refill or Medication Request (non symptomatic) Reason for Call Medication Question / Request Initial Comment Caller state that his insurance did not cover the blood pressure medication his doctor called in for him. His doctor told him she would call something else in if it did not. Translation No Nurse Assessment Nurse: Elroy Channel, RN, Rosey Bath Date/Time Lamount Cohen Time): 12/10/2019 6:58:51 PM Confirm and document reason for call. If symptomatic, describe symptoms. ---Caller states that he has 3 blood clots in his leg. Insurance will not cover medication . Will pay over 100.00 dollars Has the patient had close contact with a person known or suspected to have the novel coronavirus illness OR traveled / lives in area with major community spread (including international travel) in the last 14 days from the onset of symptoms? * If Asymptomatic, screen for exposure and travel within the last 14 days. ---No Does the patient have any new or worsening symptoms? ---No Guidelines Guideline Title Affirmed Question Affirmed Notes Nurse Date/Time (Eastern Time) Disp. Time Lamount Cohen Time) Disposition Final User 12/10/2019 6:41:23 PM Send To RN Personal Kathlen Mody, RN, Kaila 12/10/2019 7:00:58 PM Paged On Call back to [facility], RN, Rosey Bath 12/10/2019 7:20:56 PM Clinical Call Yes Elroy Channel,  RN, Rosey Bath Comments User: Criss Rosales, RN Date/Time Lamount Cohen Time): 12/10/2019 7:20:48 PMPLEASE NOTE: All timestamps contained within this report are represented as [place] Standard Time. CONFIDENTIALTY NOTICE: This fax transmission is intended only for the addressee. It contains information that is legally privileged, confidential or otherwise protected from use or disclosure. If you are not the intended recipient, you are strictly prohibited from reviewing, disclosing, copying using or disseminating any of this information or taking any action in reliance on or regarding this information. If you have received this fax in error, please notify us immediately by telephone so that we can arrange for its return to [name]. Phone: [phone], Toll-Free: [phone], Fax: [phone] Page: 2 of 2 Call Id: [id number] Comments Per MD on call, advised caller to purchase 3 day supply of medication and contact office on 12/13/2019 for PA on blood thinner. Caller states that his leg has been like this for 9 months and he will call 12/13/2019 Paging DoctorName Phone DateTime Result/Outcome Message Type Notes Danise Edge - MD [phone] 12/10/2019 7:00:58 PM Paged On Call Back to Call Center Doctor Paged Please call Rosey Bath RN @ [phone].. Thanks Danise Edge - MD 12/10/2019 7:18:59 PM Spoke with On Call - General Message Result Report given to MD. Advise caller to purchase 3 day supply and contact office on 12/13/2019 for PA on blood thinner

## 2019-12-24 ENCOUNTER — Encounter (INDEPENDENT_AMBULATORY_CARE_PROVIDER_SITE_OTHER): Payer: 59 | Admitting: Vascular Surgery

## 2020-01-05 ENCOUNTER — Other Ambulatory Visit: Payer: Self-pay | Admitting: Primary Care

## 2020-01-05 DIAGNOSIS — I82431 Acute embolism and thrombosis of right popliteal vein: Secondary | ICD-10-CM

## 2020-01-05 DIAGNOSIS — I82541 Chronic embolism and thrombosis of right tibial vein: Secondary | ICD-10-CM

## 2020-01-05 DIAGNOSIS — I82551 Chronic embolism and thrombosis of right peroneal vein: Secondary | ICD-10-CM

## 2020-01-05 NOTE — Telephone Encounter (Signed)
Please call patient:  1. Did he ever follow up with the vascular doctor? It looks like he cancelled his appointment. Why? 2. Is he taking the Eliquis twice daily? 3. Does he need a refill?  Given his numerous blood clots, I think it may be best for him to see hematology. We need to evaluate for a potential clotting disorder. Is he willing to go?

## 2020-01-05 NOTE — Telephone Encounter (Signed)
Last prescribed on 12/10/2019 . Last OV on  12/06/2019. No future OV scheduled

## 2020-01-07 NOTE — Telephone Encounter (Signed)
Patient called back earlier today.   No, he did not because he was told by another docotor that he did not need to go (did give me a name).  Yes, taking twice a day.  No, he does not need a refill.  Patient is agreeable to see hematology.

## 2020-01-07 NOTE — Telephone Encounter (Signed)
Yes, I told him that he really didn't need to go.  Referral placed to hematology.

## 2020-01-18 ENCOUNTER — Inpatient Hospital Stay: Payer: 59 | Admitting: Oncology

## 2020-01-18 ENCOUNTER — Inpatient Hospital Stay: Payer: 59

## 2020-01-25 ENCOUNTER — Inpatient Hospital Stay: Payer: 59 | Attending: Oncology | Admitting: Oncology

## 2020-01-25 ENCOUNTER — Encounter: Payer: Self-pay | Admitting: Oncology

## 2020-01-25 ENCOUNTER — Encounter (INDEPENDENT_AMBULATORY_CARE_PROVIDER_SITE_OTHER): Payer: Self-pay

## 2020-01-25 ENCOUNTER — Inpatient Hospital Stay: Payer: 59

## 2020-01-25 ENCOUNTER — Other Ambulatory Visit: Payer: Self-pay

## 2020-01-25 VITALS — BP 143/81 | HR 66 | Temp 96.2°F | Resp 18 | Wt 214.4 lb

## 2020-01-25 DIAGNOSIS — I82431 Acute embolism and thrombosis of right popliteal vein: Secondary | ICD-10-CM | POA: Diagnosis present

## 2020-01-25 DIAGNOSIS — Z8249 Family history of ischemic heart disease and other diseases of the circulatory system: Secondary | ICD-10-CM | POA: Diagnosis not present

## 2020-01-25 DIAGNOSIS — Z7901 Long term (current) use of anticoagulants: Secondary | ICD-10-CM | POA: Diagnosis not present

## 2020-01-25 LAB — COMPREHENSIVE METABOLIC PANEL
ALT: 32 U/L (ref 0–44)
AST: 31 U/L (ref 15–41)
Albumin: 4.5 g/dL (ref 3.5–5.0)
Alkaline Phosphatase: 44 U/L (ref 38–126)
Anion gap: 7 (ref 5–15)
BUN: 23 mg/dL — ABNORMAL HIGH (ref 6–20)
CO2: 26 mmol/L (ref 22–32)
Calcium: 9.2 mg/dL (ref 8.9–10.3)
Chloride: 105 mmol/L (ref 98–111)
Creatinine, Ser: 1.35 mg/dL — ABNORMAL HIGH (ref 0.61–1.24)
GFR calc Af Amer: >60 mL/min (ref >60)
GFR calc non Af Amer: >60 mL/min (ref >60)
Glucose, Bld: 85 mg/dL (ref 70–99)
Potassium: 4.3 mmol/L (ref 3.5–5.1)
Sodium: 138 mmol/L (ref 135–145)
Total Bilirubin: 0.8 mg/dL (ref 0.3–1.2)
Total Protein: 7.4 g/dL (ref 6.5–8.1)

## 2020-01-25 LAB — CBC WITH DIFFERENTIAL/PLATELET
Abs Immature Granulocytes: 0.03 10*3/uL (ref 0.00–0.07)
Basophils Absolute: 0 10*3/uL (ref 0.0–0.1)
Basophils Relative: 1 %
Eosinophils Absolute: 0.1 10*3/uL (ref 0.0–0.5)
Eosinophils Relative: 1 %
HCT: 40.3 % (ref 39.0–52.0)
Hemoglobin: 14.3 g/dL (ref 13.0–17.0)
Immature Granulocytes: 0 %
Lymphocytes Relative: 44 %
Lymphs Abs: 3.6 10*3/uL (ref 0.7–4.0)
MCH: 31.4 pg (ref 26.0–34.0)
MCHC: 35.5 g/dL (ref 30.0–36.0)
MCV: 88.4 fL (ref 80.0–100.0)
Monocytes Absolute: 0.8 10*3/uL (ref 0.1–1.0)
Monocytes Relative: 10 %
Neutro Abs: 3.5 10*3/uL (ref 1.7–7.7)
Neutrophils Relative %: 44 %
Platelets: 171 10*3/uL (ref 150–400)
RBC: 4.56 MIL/uL (ref 4.22–5.81)
RDW: 13 % (ref 11.5–15.5)
WBC: 8 10*3/uL (ref 4.0–10.5)
nRBC: 0 % (ref 0.0–0.2)

## 2020-01-25 NOTE — Progress Notes (Signed)
Hematology/Oncology Consult note [facility] Telephone:([phone] [phone]   Patient Care Team: Doreene Nest, NP as PCP - General (Internal Medicine)  REFERRING PROVIDER: Doreene Nest, NP  CHIEF COMPLAINTS/REASON FOR VISIT:  Evaluation of acute lower extremity DVT  HISTORY OF PRESENTING ILLNESS:   Eric Bolton is a  49 y.o.  male with PMH listed below was seen in consultation at the request of  Doreene Nest, NP  for evaluation of acute lower extremity DVT  12/10/2019 lower extremity venous ultrasound showed acute deep vein thrombosis involving the right popliteal vein, also age indeterminate DVT involving the right peroneal veins, right posterior tibial veins.  No DVT of left lower extremity.  Patient reports that patient noticed right lower extremity calf pain/ankle swelling 8 to 10 months prior to this ultrasound. He was in his usual state of health and discovered acute onset of right lower extremity pain and swelling.  She was treated for gout flare.  She was seen by primary care provider on 05/17/2019.  At that time the swelling persists, and was attributed to gout, lower extremity vein insufficiency.  Was recommended to use compression stocking.  The swelling of right lower extremity persist and he talked [place] physician [place] and the physician recommended him to obtain ultrasound of lower extremity to rule out DVT. Ultrasound in April 2021 is positive for acute DVT and patient was started on Eliquis 10 mg twice daily for a week followed by Eliquis 5 mg twice daily.  Patient reports that lower extremity swelling has improved although he continues to have some edema at the end of the day.  No calf pain.  Patient denies any shortness of breath, chest pain. He tolerates anticoagulation.  No bleeding symptoms. Patient was referred to establish care with hematology for further evaluation and  management.   Review of Systems  Constitutional: Negative for appetite change, chills, fatigue, fever and unexpected weight change.  HENT:   Negative for hearing loss and voice change.   Eyes: Negative for eye problems and icterus.  Respiratory: Negative for chest tightness, cough and shortness of breath.   Cardiovascular: Negative for chest pain and leg swelling.       Right lower extremity edema, worse at the end of the day  Gastrointestinal: Negative for abdominal distention and abdominal pain.  Endocrine: Negative for hot flashes.  Genitourinary: Negative for difficulty urinating, dysuria and frequency.   Musculoskeletal: Negative for arthralgias.  Skin: Negative for itching and rash.  Neurological: Negative for light-headedness and numbness.  Hematological: Negative for adenopathy. Does not bruise/bleed easily.  Psychiatric/Behavioral: Negative for confusion.    MEDICAL HISTORY:  Past Medical History:  Diagnosis Date  . DVT (deep venous thrombosis) (HCC) 2020   right LE    SURGICAL HISTORY: Past Surgical History:  Procedure Laterality Date  . HERNIA REPAIR  2009  . VASECTOMY  1999    SOCIAL HISTORY: Social History   Socioeconomic History  . Marital status: Married    Spouse name: Not on file  . Number of children: Not on file  . Years of education: Not on file  . Highest education level: Not on file  Occupational History  . Not on file  Tobacco Use  . Smoking status: Never Smoker  . Smokeless tobacco: Never Used  Substance and Sexual Activity  . Alcohol use: Yes    Comment: once a week  . Drug use: No  . Sexual activity: Not  on file  Other Topics Concern  . Not on file  Social History Narrative   Married.   3 children.    Works as a Quarry manager   Enjoys sports   Social Determinants of Radio broadcast assistant Strain:   . Difficulty of Paying Living Expenses:   Food Insecurity:   . Worried About Charity fundraiser in the Last Year:   . Arts development officer in the Last Year:   Transportation Needs:   . Film/video editor (Medical):   Marland Kitchen Lack of Transportation (Non-Medical):   Physical Activity:   . Days of Exercise per Week:   . Minutes of Exercise per Session:   Stress:   . Feeling of Stress :   Social Connections:   . Frequency of Communication with Friends and Family:   . Frequency of Social Gatherings with Friends and Family:   . Attends Religious Services:   . Active Member of Clubs or Organizations:   . Attends Archivist Meetings:   Marland Kitchen Marital Status:   Intimate Partner Violence:   . Fear of Current or Ex-Partner:   . Emotionally Abused:   Marland Kitchen Physically Abused:   . Sexually Abused:     FAMILY HISTORY: History reviewed. No pertinent family history.  ALLERGIES:  has No Known Allergies.  MEDICATIONS:  Current Outpatient Medications  Medication Sig Dispense Refill  . apixaban (ELIQUIS) 5 MG TABS tablet Take 2 tablets by mouth twice daily for 7 days, then take 1 tablet by mouth twice daily thereafter for blood clots. 116 tablet 0   No current facility-administered medications for this visit.     PHYSICAL EXAMINATION: ECOG PERFORMANCE STATUS: 0 - Asymptomatic Vitals:   01/25/20 1104  BP: (!) 143/81  Pulse: 66  Resp: 18  Temp: (!) 96.2 F (35.7 C)   Filed Weights   01/25/20 1104  Weight: 214 lb 6.4 oz (97.3 kg)    Physical Exam Constitutional:      General: He is not in acute distress. HENT:     Head: Normocephalic and atraumatic.  Eyes:     General: No scleral icterus. Cardiovascular:     Rate and Rhythm: Normal rate and regular rhythm.     Heart sounds: Normal heart sounds.  Pulmonary:     Effort: Pulmonary effort is normal. No respiratory distress.     Breath sounds: No wheezing.  Abdominal:     General: Bowel sounds are normal. There is no distension.     Palpations: Abdomen is soft.  Musculoskeletal:        General: No deformity. Normal range of motion.     Cervical back:  Normal range of motion and neck supple.     Comments: Right lower extremity trace edema  Skin:    General: Skin is warm and dry.     Findings: No erythema or rash.  Neurological:     Mental Status: He is alert and oriented to person, place, and time. Mental status is at baseline.     Cranial Nerves: No cranial nerve deficit.     Coordination: Coordination normal.  Psychiatric:        Mood and Affect: Mood normal.     LABORATORY DATA:  I have reviewed the data as listed Lab Results  Component Value Date   WBC 8.0 01/25/2020   HGB 14.3 01/25/2020   HCT 40.3 01/25/2020   MCV 88.4 01/25/2020   PLT 171 01/25/2020   Recent Labs  05/17/19 1008 01/25/20 1214  NA 137 138  K 4.5 4.3  CL 104 105  CO2 27 26  GLUCOSE 90 85  BUN 22 23*  CREATININE 1.28 1.35*  CALCIUM 9.8 9.2  GFRNONAA  --  >60  GFRAA  --  >60  PROT 7.3 7.4  ALBUMIN 4.7 4.5  AST 28 31  ALT 38 32  ALKPHOS 55 44  BILITOT 0.7 0.8   Iron/TIBC/Ferritin/ %Sat No results found for: IRON, TIBC, FERRITIN, IRONPCTSAT    RADIOGRAPHIC STUDIES: I have personally reviewed the radiological images as listed and agreed with the findings in the report. VAS US LOWER EXTREMITY VENOUS (DVT)  Result Date: 12/10/2019  Lower Venous DVTStudy Indications: Pain, and Swelling. Other Indications: Patient complains of right calf swelling and pain behind                    right knee for several weeks. He denies any shortness of                    breath. There is a family history of DVT in his brother and                    father. Risk Factors: None identified. Performing Technologist: Dondra PraderGina Gealow RVT  Examination Guidelines: A complete evaluation includes B-mode imaging, spectral Doppler, color Doppler, and power Doppler as needed of all accessible portions of each vessel. Bilateral testing is considered an integral part of a complete examination. Limited examinations for reoccurring indications may be performed as noted. The reflux  portion of the exam is performed with the patient in reverse Trendelenburg.  +---------+---------------+---------+-----------+------------+-----------------+ RIGHT    CompressibilityPhasicitySpontaneityProperties  Thrombus Aging    +---------+---------------+---------+-----------+------------+-----------------+ CFV      Full           Yes      Yes                                      +---------+---------------+---------+-----------+------------+-----------------+ SFJ      Full           Yes      Yes                                      +---------+---------------+---------+-----------+------------+-----------------+ FV Prox  Full           Yes      Yes                                      +---------+---------------+---------+-----------+------------+-----------------+ FV Mid   Full           Yes      Yes                                      +---------+---------------+---------+-----------+------------+-----------------+ FV DistalFull           Yes      Yes                                      +---------+---------------+---------+-----------+------------+-----------------+ PFV  Full                                                             +---------+---------------+---------+-----------+------------+-----------------+ POP      Partial        No       No         softly      Acute                                                         echogenic                     +---------+---------------+---------+-----------+------------+-----------------+ PTV      Partial        No       No         retracted   Age Indeterminate +---------+---------------+---------+-----------+------------+-----------------+ PERO     Partial        No       No         retracted   Age Indeterminate +---------+---------------+---------+-----------+------------+-----------------+ Gastroc  Full                                                              +---------+---------------+---------+-----------+------------+-----------------+ GSV      Full           Yes      Yes                                      +---------+---------------+---------+-----------+------------+-----------------+   +---------+---------------+---------+-----------+----------+--------------+ LEFT     CompressibilityPhasicitySpontaneityPropertiesThrombus Aging +---------+---------------+---------+-----------+----------+--------------+ CFV      Full           Yes      Yes                                 +---------+---------------+---------+-----------+----------+--------------+ SFJ      Full           Yes      Yes                                 +---------+---------------+---------+-----------+----------+--------------+ FV Prox  Full           Yes      Yes                                 +---------+---------------+---------+-----------+----------+--------------+ FV Mid   Full           Yes      Yes                                 +---------+---------------+---------+-----------+----------+--------------+  FV DistalFull           Yes      Yes                                 +---------+---------------+---------+-----------+----------+--------------+ PFV      Full                                                        +---------+---------------+---------+-----------+----------+--------------+ POP      Full           Yes      Yes                                 +---------+---------------+---------+-----------+----------+--------------+ PTV      Full           Yes      Yes                                 +---------+---------------+---------+-----------+----------+--------------+ PERO     Full           Yes      Yes                                 +---------+---------------+---------+-----------+----------+--------------+ Gastroc  Full                                                         +---------+---------------+---------+-----------+----------+--------------+ GSV      Full           Yes      Yes                                 +---------+---------------+---------+-----------+----------+--------------+  Findings reported to Rena at 3:35 pm. Patient instructed to go home and wait for physicians phone call.  Summary: RIGHT: - Findings consistent with acute deep vein thrombosis involving the right popliteal vein. - Findings consistent with age indeterminate deep vein thrombosis involving the right peroneal veins, and right posterior tibial veins. - No cystic structure found in the popliteal fossa. - All other veins visualized appear fully compressible and demonstrate appropriate Doppler characteristics.  LEFT: - No evidence of deep vein thrombosis in the lower extremity. No indirect evidence of obstruction proximal to the inguinal ligament. - No cystic structure found in the popliteal fossa.  *See table(s) above for measurements and observations. Electronically signed by Tobias Alexander MD on 12/10/2019 at 5:20:29 PM.    Final       ASSESSMENT & PLAN:  1. Acute deep vein thrombosis (DVT) of popliteal vein of right lower extremity (HCC)   2. Family history of deep vein thrombosis    Images were independently viewed by me and discussed with patient. From his clinical history, this is most likely an unprovoked acute DVT that was detected on 12/10/2019.   He does  have age indeterminate right lower extremity DVT which might be there when his symptoms started last summer. Currently he is doing very well on anticoagulation with Eliquis 5 mg twice daily. We discussed about the plan of continued 3 to 6 months of anticoagulation and then decrease to Eliquis 2.5 mg twice daily. I recommend patient to have hypercoagulable work-up given family history of DVT, relatively early onset of DVT despite being active. Check prothrombin gene mutation and factor V Leiden mutation. I recommend to repeat  right lower extremity venous ultrasound in August and patient to follow-up with me at that point. I will order hypercoagulable work-up at that point.  Orders Placed This Encounter  Procedures  . US Venous Img Lower Unilateral Right    Standing Status:   Future    Standing Expiration Date:   01/24/2021    Order Specific Question:   Reason for Exam (SYMPTOM  OR DIAGNOSIS REQUIRED)    Answer:   DVT follow up    Order Specific Question:   Preferred imaging location?    Answer:   [facility]  . Factor 5 leiden    Standing Status:   Future    Number of Occurrences:   1    Standing Expiration Date:   01/24/2021  . Prothrombin gene mutation    Standing Status:   Future    Number of Occurrences:   1    Standing Expiration Date:   01/24/2021  . CBC with Differential/Platelet    Standing Status:   Future    Number of Occurrences:   1    Standing Expiration Date:   01/24/2021  . Comprehensive metabolic panel    Standing Status:   Future    Number of Occurrences:   1    Standing Expiration Date:   01/24/2021    All questions were answered. The patient knows to call the clinic with any problems questions or concerns.  cc Doreene Nest, NP    Return of visit: 2 to 3 months. Thank you for this kind referral and the opportunity to participate in the care of this patient. A copy of today's note is routed to referring provider    Rickard Patience, MD, PhD Hematology Oncology [facility] Pager- [phone] 01/25/2020

## 2020-01-25 NOTE — Progress Notes (Signed)
New evaluation for hematology.  Family history of DVT with father and brother.

## 2020-01-28 LAB — FACTOR 5 LEIDEN

## 2020-01-31 LAB — PROTHROMBIN GENE MUTATION

## 2020-03-09 ENCOUNTER — Other Ambulatory Visit: Payer: Self-pay | Admitting: Primary Care

## 2020-03-09 DIAGNOSIS — I82541 Chronic embolism and thrombosis of right tibial vein: Secondary | ICD-10-CM

## 2020-03-09 DIAGNOSIS — I82431 Acute embolism and thrombosis of right popliteal vein: Secondary | ICD-10-CM

## 2020-03-09 DIAGNOSIS — I82551 Chronic embolism and thrombosis of right peroneal vein: Secondary | ICD-10-CM

## 2020-03-10 NOTE — Telephone Encounter (Signed)
Hi Dr. Cathie Hoops,  I'm sending this Eliquis refill request to you for assistance. I reviewed your last note, how much longer do you intend for him to remain on 5 mg BID before reducing to 2.5 mg? I appreciate your help!  Mayra Reel, NP-C

## 2020-03-10 NOTE — Telephone Encounter (Signed)
Last prescribed on 12/10/2019 Last OV (acute ) with Eric Bolton on 12/06/2019 No future OV scheduled

## 2020-03-13 ENCOUNTER — Other Ambulatory Visit: Payer: Self-pay | Admitting: Oncology

## 2020-03-13 MED ORDER — APIXABAN 5 MG PO TABS: 5.0000 mg | ORAL_TABLET | Freq: Two times a day (BID) | ORAL | 2 refills | Status: DC

## 2020-03-13 NOTE — Telephone Encounter (Signed)
I planned total 6 months of Eliquis 5mg  BID before switching to 2.5mg  BID.

## 2020-04-03 ENCOUNTER — Telehealth: Payer: Self-pay | Admitting: *Deleted

## 2020-04-03 ENCOUNTER — Ambulatory Visit: Payer: 59

## 2020-04-03 NOTE — Telephone Encounter (Signed)
[name] called reporting that patient did not show up for his appointment today

## 2020-04-03 NOTE — Telephone Encounter (Signed)
Eric Bolton please reschedule [name] and MD appt (to a few days after [name]). Notify pt of new appts.

## 2020-04-04 NOTE — Telephone Encounter (Signed)
Schedule message sent. 

## 2020-04-06 ENCOUNTER — Inpatient Hospital Stay: Payer: 59 | Admitting: Oncology

## 2020-04-11 ENCOUNTER — Ambulatory Visit: Admission: RE | Admit: 2020-04-11 | Payer: 59 | Source: Ambulatory Visit

## 2020-04-12 ENCOUNTER — Telehealth: Payer: Self-pay

## 2020-04-12 DIAGNOSIS — I2699 Other pulmonary embolism without acute cor pulmonale: Secondary | ICD-10-CM

## 2020-04-12 HISTORY — DX: Other pulmonary embolism without acute cor pulmonale: I26.99

## 2020-04-12 NOTE — Telephone Encounter (Signed)
Done.  Both [name] and MD/US Results appts were R/S as requested. Called pt and made him aware of his scheduled 04/19/20.Marland Kitchen 3:00 [name] @ [facility] must arrive by 2:30 and RTC a few days later to see MD for his [name] results, MD appt is sched for 9/13 @ 8:45 date and time per pt request, he stated that was the only date/time that he could come due to his work schedule.

## 2020-04-12 NOTE — Telephone Encounter (Signed)
Patient cancelled the [name] on 04/11/20 and is scheduled to see MD tomorrow.  Check out from 01/2020 MD appt: [name] RLE - end of AUG  MD a few days after [name].  Please r/s [name] and MD appt

## 2020-04-13 ENCOUNTER — Inpatient Hospital Stay: Payer: 59 | Admitting: Oncology

## 2020-04-17 ENCOUNTER — Emergency Department: Payer: 59

## 2020-04-17 ENCOUNTER — Other Ambulatory Visit: Payer: Self-pay

## 2020-04-17 ENCOUNTER — Inpatient Hospital Stay
Admission: EM | Admit: 2020-04-17 | Discharge: 2020-04-18 | DRG: 299 | Disposition: A | Discharge status: Home / Self Care | Payer: 59 | Attending: Family Medicine | Admitting: Family Medicine

## 2020-04-17 DIAGNOSIS — I2609 Other pulmonary embolism with acute cor pulmonale: Secondary | ICD-10-CM | POA: Diagnosis not present

## 2020-04-17 DIAGNOSIS — I824Y1 Acute embolism and thrombosis of unspecified deep veins of right proximal lower extremity: Secondary | ICD-10-CM

## 2020-04-17 DIAGNOSIS — Z86718 Personal history of other venous thrombosis and embolism: Secondary | ICD-10-CM | POA: Diagnosis not present

## 2020-04-17 DIAGNOSIS — D6851 Activated protein C resistance: Secondary | ICD-10-CM | POA: Diagnosis present

## 2020-04-17 DIAGNOSIS — I2699 Other pulmonary embolism without acute cor pulmonale: Secondary | ICD-10-CM | POA: Diagnosis present

## 2020-04-17 DIAGNOSIS — Z20822 Contact with and (suspected) exposure to covid-19: Secondary | ICD-10-CM | POA: Diagnosis present

## 2020-04-17 DIAGNOSIS — I82431 Acute embolism and thrombosis of right popliteal vein: Secondary | ICD-10-CM | POA: Diagnosis not present

## 2020-04-17 DIAGNOSIS — Z9114 Patient's other noncompliance with medication regimen: Secondary | ICD-10-CM | POA: Diagnosis not present

## 2020-04-17 DIAGNOSIS — Z9852 Vasectomy status: Secondary | ICD-10-CM

## 2020-04-17 DIAGNOSIS — E785 Hyperlipidemia, unspecified: Secondary | ICD-10-CM | POA: Diagnosis present

## 2020-04-17 LAB — CBC
HCT: 39.9 % (ref 39.0–52.0)
Hemoglobin: 14.1 g/dL (ref 13.0–17.0)
MCH: 31.7 pg (ref 26.0–34.0)
MCHC: 35.3 g/dL (ref 30.0–36.0)
MCV: 89.7 fL (ref 80.0–100.0)
Platelets: 129 10*3/uL — ABNORMAL LOW (ref 150–400)
RBC: 4.45 MIL/uL (ref 4.22–5.81)
RDW: 12.2 % (ref 11.5–15.5)
WBC: 10.6 10*3/uL — ABNORMAL HIGH (ref 4.0–10.5)
nRBC: 0 % (ref 0.0–0.2)

## 2020-04-17 LAB — COMPREHENSIVE METABOLIC PANEL
ALT: 40 U/L (ref 0–44)
AST: 37 U/L (ref 15–41)
Albumin: 4.4 g/dL (ref 3.5–5.0)
Alkaline Phosphatase: 56 U/L (ref 38–126)
Anion gap: 8 (ref 5–15)
BUN: 16 mg/dL (ref 6–20)
CO2: 26 mmol/L (ref 22–32)
Calcium: 9 mg/dL (ref 8.9–10.3)
Chloride: 103 mmol/L (ref 98–111)
Creatinine, Ser: 1.28 mg/dL — ABNORMAL HIGH (ref 0.61–1.24)
GFR calc Af Amer: >60 mL/min (ref >60)
GFR calc non Af Amer: >60 mL/min (ref >60)
Glucose, Bld: 109 mg/dL — ABNORMAL HIGH (ref 70–99)
Potassium: 3.9 mmol/L (ref 3.5–5.1)
Sodium: 137 mmol/L (ref 135–145)
Total Bilirubin: 1.1 mg/dL (ref 0.3–1.2)
Total Protein: 7.4 g/dL (ref 6.5–8.1)

## 2020-04-17 LAB — HEPARIN LEVEL (UNFRACTIONATED)
Heparin Unfractionated: <0.1 IU/mL — ABNORMAL LOW (ref 0.30–0.70)
Heparin Unfractionated: 0.36 IU/mL (ref 0.30–0.70)
Heparin Unfractionated: 2.78 IU/mL — ABNORMAL HIGH (ref 0.30–0.70)

## 2020-04-17 LAB — SARS CORONAVIRUS 2 BY RT PCR (HOSPITAL ORDER, PERFORMED IN ~~LOC~~ HOSPITAL LAB): SARS Coronavirus 2: NEGATIVE

## 2020-04-17 LAB — APTT: aPTT: 29 seconds (ref 24–36)

## 2020-04-17 LAB — PROTIME-INR
INR: 1.1 (ref 0.8–1.2)
Prothrombin Time: 13.4 seconds (ref 11.4–15.2)

## 2020-04-17 LAB — HIV ANTIBODY (ROUTINE TESTING W REFLEX): HIV Screen 4th Generation wRfx: NONREACTIVE

## 2020-04-17 IMAGING — US US EXTREM LOW VENOUS*R*
1 series · 15 of 24 positions shown · non-contrast
Comparison: None.

CLINICAL DATA: DVT with increased pain and swelling. History of DVT
on Eliquis, but ran out of medication recently. Recent travel.

EXAM:
RIGHT LOWER EXTREMITY VENOUS DOPPLER ULTRASOUND
TECHNIQUE: Gray-scale sonography with compression, as well as color and duplex
ultrasound, were performed to evaluate the deep venous system(s)
from the level of the common femoral vein through the popliteal and
proximal calf veins.

[Series 1: us extrem low venous*right* · 15 of 32 slices shown]
[im 1/32]
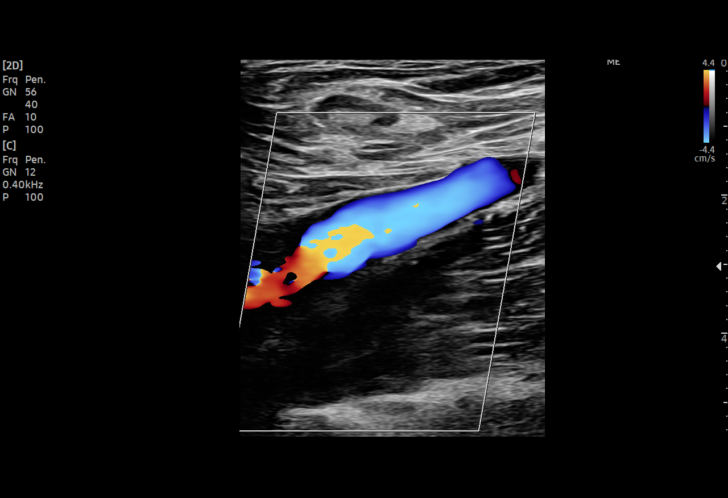
[im 3/32]
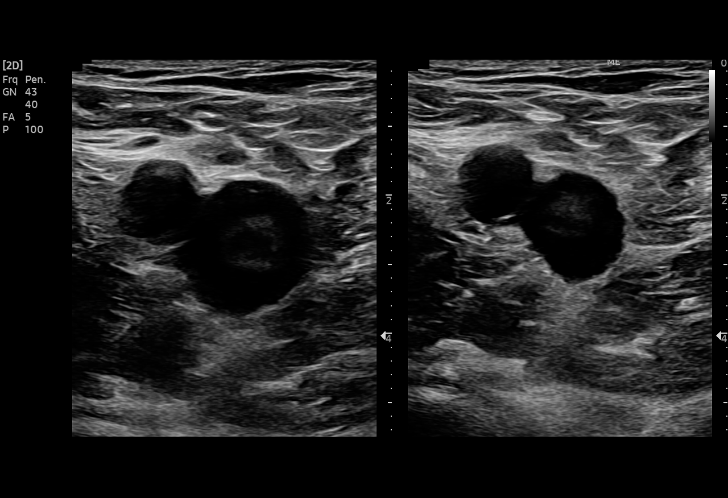
[im 6/32]
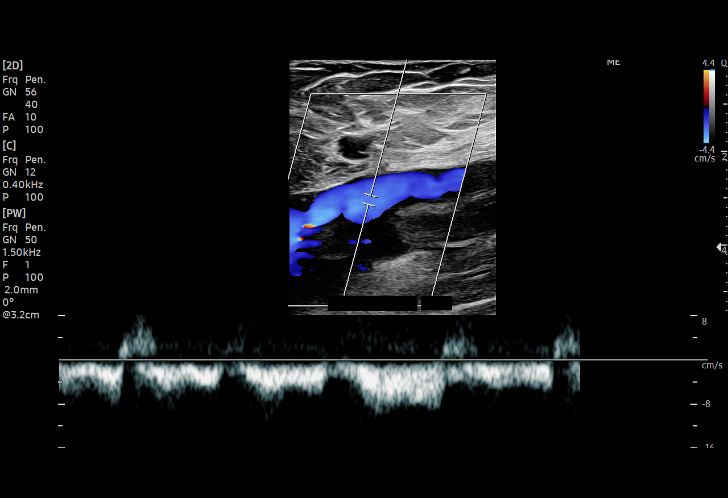
[im 7/32]
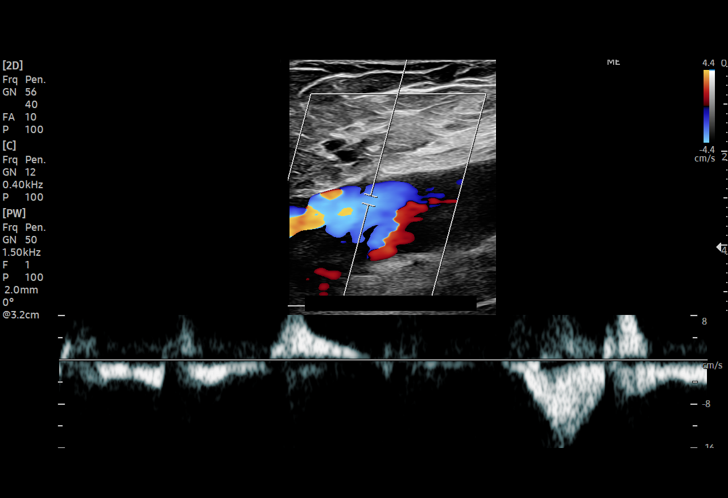
[im 10/32]
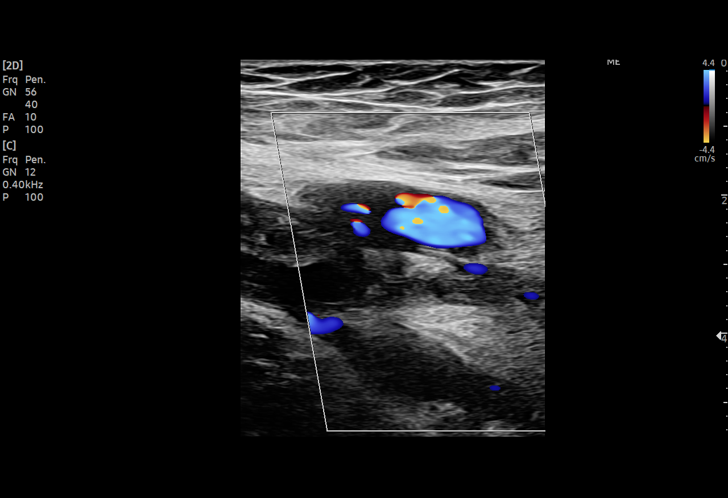
[im 11/32]
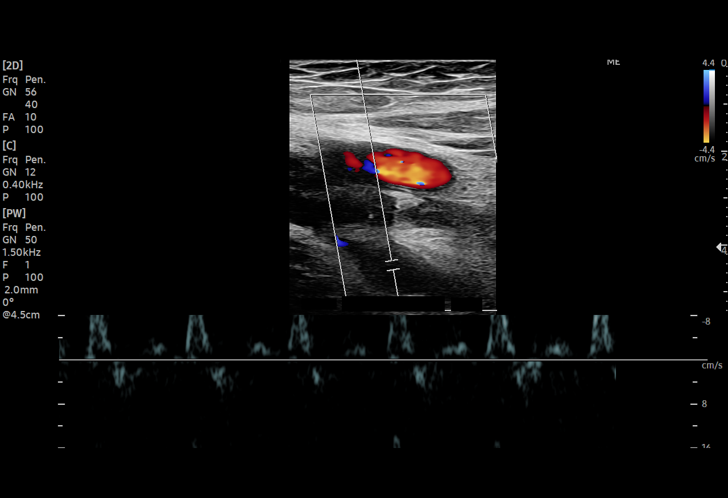
[im 14/32]
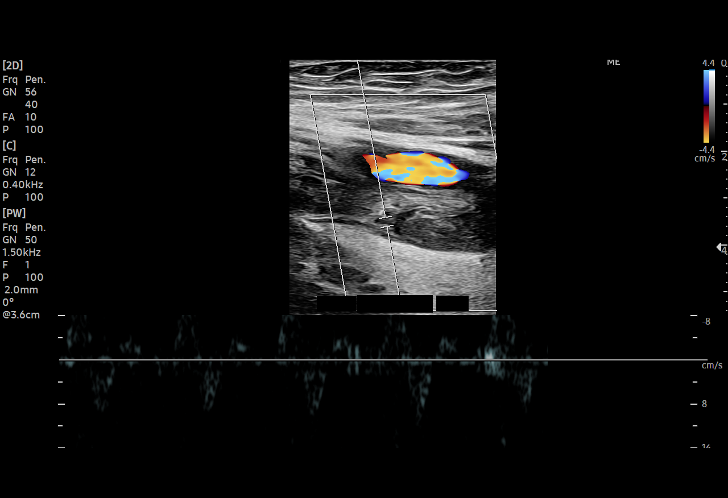
[im 17/32]
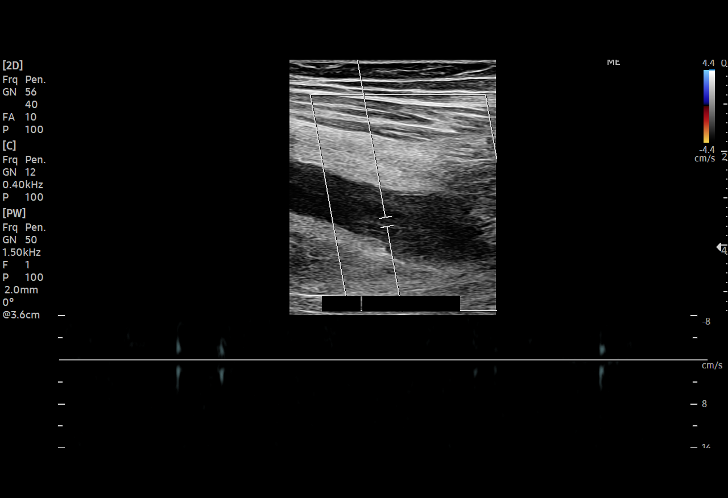
[im 18/32]
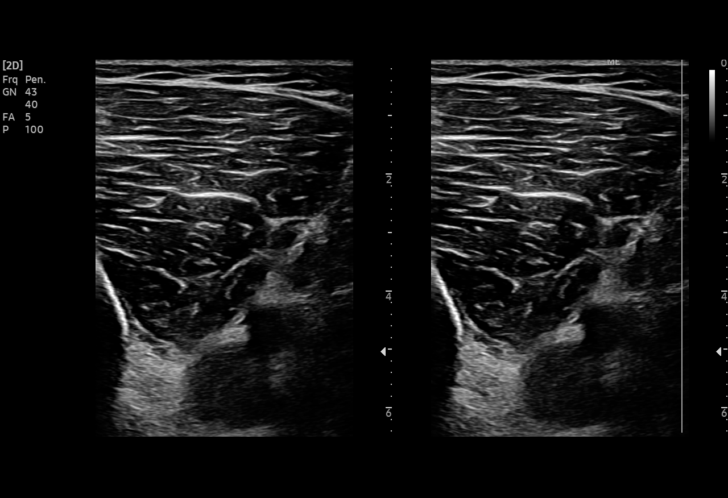
[im 21/32]
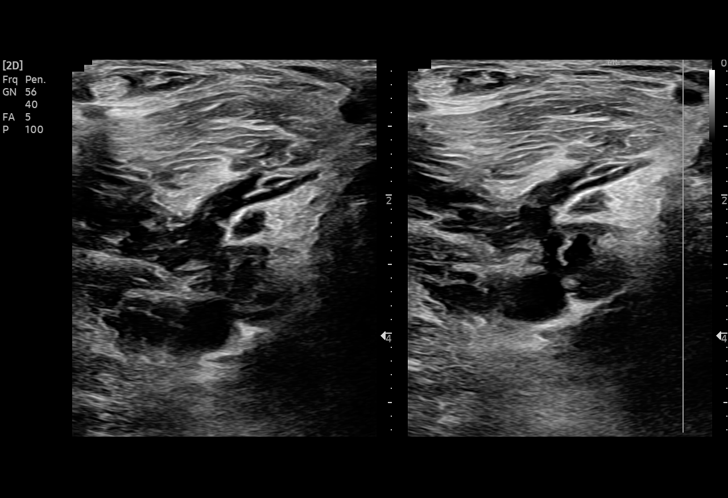
[im 22/32]
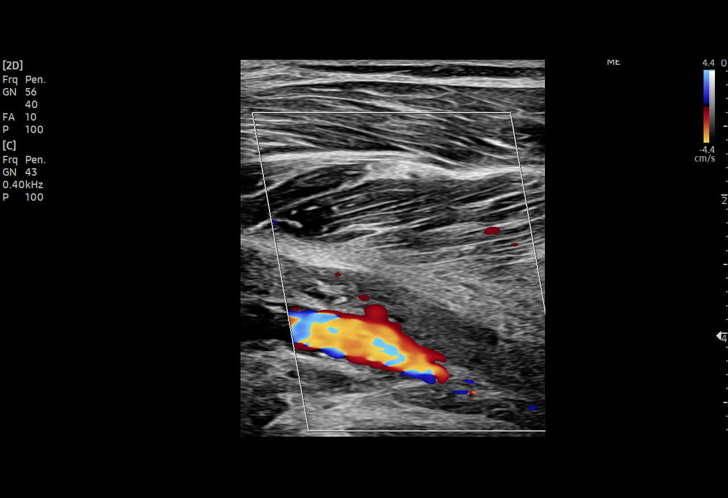
[im 25/32]
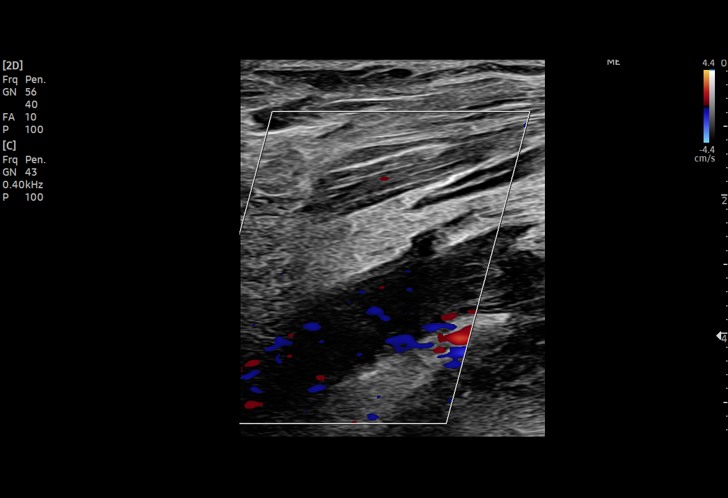
[im 27/32]
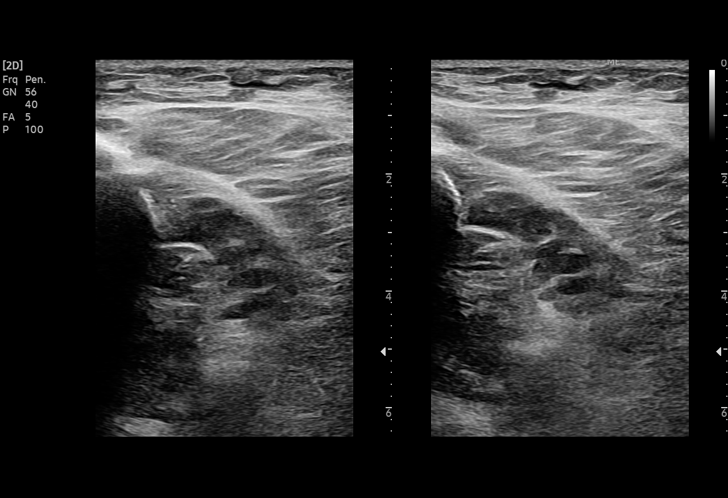
[im 29/32]
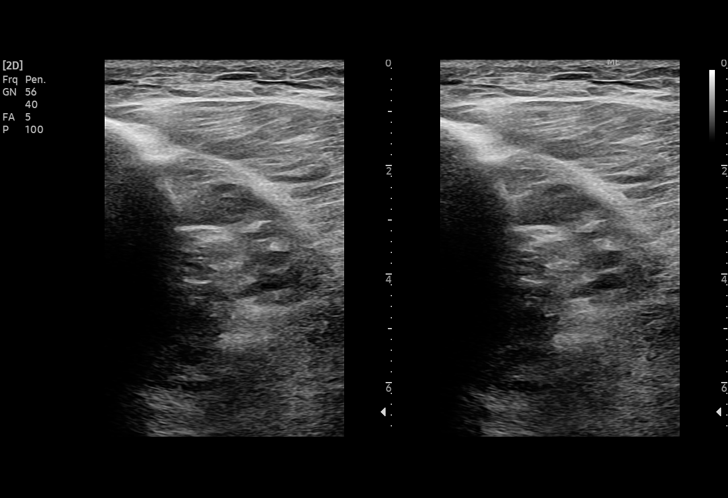
[im 32/32]
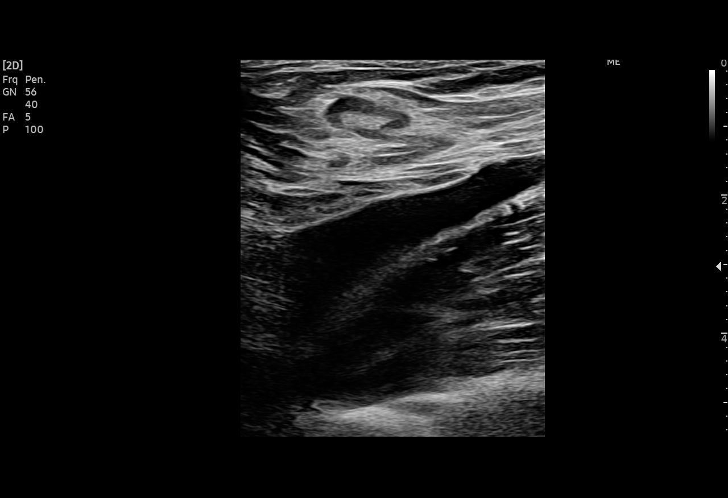

[15 of 24 positions shown; findings below may reference images not displayed]

FINDINGS: Nonocclusive clot at the common femoral vein on the right, mobile in
the lumen on the cine clip. Occlusive clot with venous expansion at
the right femoral vein, popliteal vein, and also involving the deep
calf veins. Phasic flow is seen at the left common femoral vein
which is patent.

These results were called by telephone at the time of interpretation
on [DATE] at [DATE] to provider MARJAN , who
verbally acknowledged these results.
IMPRESSION: Extensive occlusive DVT throughout the right lower extremity
extending from the common femoral vein into the calf.

## 2020-04-17 MED ORDER — HEPARIN BOLUS VIA INFUSION
6000.0000 [IU] | Freq: Once | INTRAVENOUS | Status: AC
  Administered 2020-04-17: 6000 [IU] via INTRAVENOUS
  Filled 2020-04-17: qty 6000

## 2020-04-17 MED ORDER — SODIUM CHLORIDE 0.9 % IV SOLN: 250.0000 mL | INTRAVENOUS | Status: DC | PRN

## 2020-04-17 MED ORDER — IOHEXOL 350 MG/ML SOLN
100.0000 mL | Freq: Once | INTRAVENOUS | Status: AC | PRN
  Administered 2020-04-17: 100 mL via INTRAVENOUS

## 2020-04-17 MED ORDER — ONDANSETRON HCL 4 MG PO TABS: 4.0000 mg | ORAL_TABLET | Freq: Four times a day (QID) | ORAL | Status: DC | PRN

## 2020-04-17 MED ORDER — HEPARIN (PORCINE) 25000 UT/250ML-% IV SOLN
1550.0000 [IU]/h | INTRAVENOUS | Status: DC
  Administered 2020-04-17 – 2020-04-18 (×2): 1550 [IU]/h via INTRAVENOUS
  Filled 2020-04-17 (×2): qty 250

## 2020-04-17 MED ORDER — SODIUM CHLORIDE 0.9% FLUSH
3.0000 mL | Freq: Two times a day (BID) | INTRAVENOUS | Status: DC
  Administered 2020-04-17: 3 mL via INTRAVENOUS

## 2020-04-17 MED ORDER — ONDANSETRON HCL 4 MG/2ML IJ SOLN: 4.0000 mg | Freq: Four times a day (QID) | INTRAMUSCULAR | Status: DC | PRN

## 2020-04-17 MED ORDER — SODIUM CHLORIDE 0.9% FLUSH: 3.0000 mL | INTRAVENOUS | Status: DC | PRN

## 2020-04-17 MED ORDER — ACETAMINOPHEN 325 MG PO TABS: 650.0000 mg | ORAL_TABLET | Freq: Four times a day (QID) | ORAL | Status: DC | PRN

## 2020-04-17 MED ORDER — ACETAMINOPHEN 650 MG RE SUPP: 650.0000 mg | Freq: Four times a day (QID) | RECTAL | Status: DC | PRN

## 2020-04-17 NOTE — ED Provider Notes (Signed)
-----------------------------------------   10:11 AM on 04/17/2020 -----------------------------------------  I received verbal report from radiology over the phone that DVT shows a large acute DVT.  I asked the charge RN to have the patient placed in a room.   Dionne Bucy, MD 04/17/20 1012

## 2020-04-17 NOTE — ED Notes (Signed)
Heparin paused per pharmacy instruction due to heparin unfractionated level. Awaiting pharmacy further instructions

## 2020-04-17 NOTE — ED Notes (Signed)
Assumed care of pt, denies cp or sob. Wife at bedside. Labs obtained, awaiting results. AOx4. Pt resting comfortably with regular and unlabored breathing. Denies needs or concerns. AOx4. Side rail up x2. Call bell within reach, bed lowered.

## 2020-04-17 NOTE — ED Triage Notes (Signed)
Pt states he was dx with DVT in the RLE and was taking elaquis and ran out a couple weeks ago, states he started having increased pain to the right thigh, states he just drove back from [place] yesterday

## 2020-04-17 NOTE — Consult Note (Signed)
49 yo with history of DVT, on Eliquis, but ran out 1 month ago, now presents with right leg swelling>  U/s Shows extensive right leg DVT.  CTA shows bilateral PE.  PAtient is on room air.  I agree with hospitalists admission for IV heparin.  Please make him NPO after midnight.  Vascular will see him in the am for possible lysis   Wells Niamya Vittitow

## 2020-04-17 NOTE — Progress Notes (Signed)
ANTICOAGULATION CONSULT NOTE - Initial Consult  Pharmacy Consult for Heparin infusion  Indication: pulmonary embolus and DVT  No Known Allergies  Patient Measurements: Height: 6\' 2"  (188 cm) Weight: 93 kg (205 lb) IBW/kg (Calculated) : 82.2  Vital Signs: Temp: 97.9 F (36.6 C) (09/06 0911) Temp Source: Oral (09/06 0911) BP: 107/88 (09/06 0911) Pulse Rate: 95 (09/06 0911)  Labs: Recent Labs    04/17/20 1034  HGB 14.1  HCT 39.9  PLT 129*  CREATININE 1.28*    Estimated Creatinine Clearance: 82.9 mL/min (A) (by C-G formula based on SCr of 1.28 mg/dL (H)).  Medical History: Past Medical History:  Diagnosis Date  . DVT (deep venous thrombosis) (HCC) 2020   right LE   Assessment: 49 yo male presented to ED with increased pain to right thigh. Patient had recent diagnosis of DVT, but ran of of Eliquis a couple weeks ago.    57 shows extensive occlusive DVT throughout the right lower extremity extending from the common femoral vein into the calf. CT Chest shows bilateral central pulmonary emboli. Pharmacy has been consulted for heparin infusion dosing and monitoring.   Goal of Therapy:  Heparin level 0.3-0.7 units/ml Monitor platelets by anticoagulation protocol: Yes   Plan:  Baseline labs ordered Give 6000 units bolus x 1 Start heparin infusion at 1550 units/hr Check anti-Xa level in 6 hours and daily while on heparin Continue to monitor H&H and platelets  [name], PharmD, BCPS Clinical Pharmacist 04/17/2020 1:18 PM

## 2020-04-17 NOTE — ED Provider Notes (Signed)
[facility] Emergency Department Provider Note  Time seen: 10:24 AM  I have reviewed the triage vital signs and the nursing notes.   HISTORY  Chief Complaint Leg Pain (DVT)   HPI Eric Bolton is a 49 y.o. male with a past medical history of a right lower extremity DVT presents emergency department for right leg pain and mild shortness of breath.  According to the patient in April of this year he was diagnosed with a right lower extremity DVT and started on Eliquis.  Patient states he ran out of Eliquis approximately 1 month ago and since yesterday has been experiencing increased right lower extremity pain.  States his right lower extremity feels heavy and aching throughout.  Patient also states since yesterday he has been experiencing very mild shortness of breath with exertion.  Denies any chest pain.  No fever.   Past Medical History:  Diagnosis Date  . DVT (deep venous thrombosis) (HCC) 2020   right LE    Patient Active Problem List   Diagnosis Date Noted  . Acute deep vein thrombosis (DVT) of popliteal vein of right lower extremity (HCC) 01/25/2020  . Lower extremity edema 05/17/2019  . Decreased renal function 11/21/2016  . Hyperlipidemia 11/21/2016  . Preventative health care 11/21/2015    Past Surgical History:  Procedure Laterality Date  . HERNIA REPAIR  2009  . VASECTOMY  1999    Prior to Admission medications   Medication Sig Start Date End Date Taking? Authorizing Provider  apixaban (ELIQUIS) 5 MG TABS tablet Take 1 tablet (5 mg total) by mouth 2 (two) times daily. 03/13/20   Rickard Patience, MD    No Known Allergies  No family history on file.  Social History Social History   Tobacco Use  . Smoking status: Never Smoker  . Smokeless tobacco: Never Used  Substance Use Topics  . Alcohol use: Yes    Comment: once a week  . Drug use: No    Review of Systems Constitutional: Negative for fever. Cardiovascular: Negative for chest  pain. Respiratory: Mild shortness of breath with exertion Gastrointestinal: Negative for abdominal pain, vomiting  Genitourinary: Negative for urinary compaints Musculoskeletal: Negative for musculoskeletal complaints Neurological: Negative for headache All other ROS negative  ____________________________________________   PHYSICAL EXAM:  VITAL SIGNS: ED Triage Vitals  Enc Vitals Group     BP 04/17/20 0911 107/88     Pulse Rate 04/17/20 0911 95     Resp 04/17/20 0911 17     Temp 04/17/20 0911 97.9 F (36.6 C)     Temp Source 04/17/20 0911 Oral     SpO2 04/17/20 0911 97 %     Weight 04/17/20 0913 205 lb (93 kg)     Height 04/17/20 0913 6\' 2"  (1.88 m)     Head Circumference --      Peak Flow --      Pain Score 04/17/20 0913 4     Pain Loc --      Pain Edu? --      Excl. in GC? --    Constitutional: Alert and oriented. Well appearing and in no distress. Eyes: Normal exam ENT      Head: Normocephalic and atraumatic.      Mouth/Throat: Mucous membranes are moist. Cardiovascular: Normal rate, regular rhythm. No murmur Respiratory: Normal respiratory effort without tachypnea nor retractions. Breath sounds are clear  Gastrointestinal: Soft and nontender. No distention. Musculoskeletal: Nontender with normal range of motion in all extremities. Neurologic:  Normal speech and language. No gross focal neurologic deficits Skin:  Skin is warm, dry and intact.  Psychiatric: Mood and affect are normal.  ____________________________________________   RADIOLOGY  Ultrasound consistent with extensive occlusive DVT throughout the right lower extremity.  ____________________________________________   INITIAL IMPRESSION / ASSESSMENT AND PLAN / ED COURSE  Pertinent labs & imaging results that were available during my care of the patient were reviewed by me and considered in my medical decision making (see chart for details).   Patient presents emergency department for increased  right lower extremity pain.  Known DVT of the right lower extremity now off of his Eliquis x1 month.  I reviewed the patient's chart from December 10, 2019 patient had a popliteal and calf DVT.  Today's ultrasound is consistent with a larger DVT extending through the femoral veins.  On exam patient has tenderness throughout the leg with mild swelling no discoloration.  Warm and neuro vastly intact.  Given the patient's mild shortness of breath we will check labs and proceed with a CTA of the chest to rule out PE.  We will also check a Covid swab as a precaution.  Given the extensive DVT we will discussed with vascular if CTA is negative.  CTA is consistent with significant pulmonary embolism nearly saddle embolism with right heart strain.  We will discussed with vascular surgery.  We will start on a heparin infusion.  Patient will be admitted further work-up and treatment to the hospitalist service.  Spoke to Dr. Myra Gianotti of vascular surgery.  Patient will be n.p.o. after midnight with possible procedure tomorrow.  Patient is hemodynamically stable currently with plan to admit to the hospital service on heparin infusion.  Eric Bolton was evaluated in Emergency Department on 04/17/2020 for the symptoms described in the history of present illness. He was evaluated in the context of the global COVID-19 pandemic, which necessitated consideration that the patient might be at risk for infection with the SARS-CoV-2 virus that causes COVID-19. Institutional protocols and algorithms that pertain to the evaluation of patients at risk for COVID-19 are in a state of rapid change based on information released by regulatory bodies including the CDC and federal and state organizations. These policies and algorithms were followed during the patient's care in the ED.  CRITICAL CARE Performed by: Minna Antis   Total critical care time: 30 minutes  Critical care time was exclusive of separately billable  procedures and treating other patients.  Critical care was necessary to treat or prevent imminent or life-threatening deterioration.  Critical care was time spent personally by me on the following activities: development of treatment plan with patient and/or surrogate as well as nursing, discussions with consultants, evaluation of patient's response to treatment, examination of patient, obtaining history from patient or surrogate, ordering and performing treatments and interventions, ordering and review of laboratory studies, ordering and review of radiographic studies, pulse oximetry and re-evaluation of patient's condition.   ____________________________________________   FINAL CLINICAL IMPRESSION(S) / ED DIAGNOSES  DVT Pulmonary embolism   Minna Antis, MD 04/17/20 1445

## 2020-04-17 NOTE — Progress Notes (Signed)
ANTICOAGULATION CONSULT NOTE - Initial Consult  Pharmacy Consult for Heparin infusion  Indication: pulmonary embolus and DVT  No Known Allergies  Patient Measurements: Height: 6\' 2"  (188 cm) Weight: 93 kg (205 lb) IBW/kg (Calculated) : 82.2  Vital Signs: BP: 110/80 (09/06 2000) Pulse Rate: 80 (09/06 2000)  Labs: Recent Labs    04/17/20 1034 04/17/20 1236 04/17/20 1905  HGB 14.1  --   --   HCT 39.9  --   --   PLT 129*  --   --   APTT  --  29  --   LABPROT  --  13.4  --   INR  --  1.1  --   HEPARINUNFRC  --  <0.10* 0.36  2.78*  CREATININE 1.28*  --   --     Estimated Creatinine Clearance: 82.9 mL/min (A) (by C-G formula based on SCr of 1.28 mg/dL (H)).  Medical History: Past Medical History:  Diagnosis Date  . DVT (deep venous thrombosis) (HCC) 2020   right LE   Assessment: 49 yo male presented to ED with increased pain to right thigh. Patient had recent diagnosis of DVT, but ran of of Eliquis a couple weeks ago.    57 shows extensive occlusive DVT throughout the right lower extremity extending from the common femoral vein into the calf. CT Chest shows bilateral central pulmonary emboli. Pharmacy has been consulted for heparin infusion dosing and monitoring.   Goal of Therapy:  Heparin level 0.3-0.7 units/ml Monitor platelets by anticoagulation protocol: Yes   Plan:  09/06 @ 2100 HL 0.36 therapeutic. Heparin drip was stopped at 1900 for falsely elevated HL of 2.78, STAT redrawn was done showing level above. Spoke to RN to restart heparin at previous rate of 1550 units/hr and will recheck HL at 0400 and continue to monitor.  11/06, PharmD, BCPS Clinical Pharmacist 04/17/2020 9:57 PM

## 2020-04-17 NOTE — ED Notes (Signed)
Heparin restarted and verified with NSG, RN. Pt denies needs or concerns at this time. Wife remains at bedside. AO x4.

## 2020-04-17 NOTE — ED Notes (Signed)
Full blood rainbow sent to Lab

## 2020-04-17 NOTE — H&P (Signed)
History and Physical    Eric Bolton EXB:[id number] DOB: Oct 04, 1970 DOA: 04/17/2020  PCP: Doreene Nest, NP   Patient coming from: Home  I have personally briefly reviewed patient's old medical records in [facility]  Chief Complaint: Right leg pain  HPI: Eric Bolton is a 49 y.o. male with medical history significant for right lower extremity DVT diagnosed about in April 2021 who presents to the ER for worsening pain and swelling in his right leg associated with exertional shortness of breath and fatigue.  Patient was discharged home on Eliquis after his initial diagnosis of right lower extremity DVT and received a 90-day supply.  He states he ran out of his Eliquis approximately a month ago and 1 day prior to his admission he developed worsening pain and swelling in his right lower extremity.  He noticed the pain after he drove 8 hours from [place]. He denies having any chest pain, no fever, no chills, no abdominal pain, no dizziness, no lightheadedness, no nausea or vomiting. Labs show sodium 137, potassium 3.9, chloride 103, bicarb 26, BUN 16, creatinine 1.28, calcium 9, AST 37, ALT 40, white count 10.6, hemoglobin 14.1, hematocrit 39, platelet count 129, PT 13.4, INR 1.1, CT angiogram of the chest shows bilateral central pulmonary emboli with the most central embolus within the left main pulmonary artery. The thrombus becomes nearly occlusive at the bifurcation of the lingula and left lower lobe lobar arteries. Near occlusive pulmonary embolus in the distal right main pulmonary artery, extending to the lobar branches of the upper and lower lobes. Right heart strain with RV / LV ratio of 1.4. Normal appearance of the lungs. Venous Doppler involving the right lower extremity showed extensive occlusive DVT throughout the right lower extremity extending from the common femoral vein into the calf.      ED Course: Patient is a 49 year old Caucasian male with a history  of right lower extremity DVT who has been noncompliant with anticoagulation treatment and presents to the ER for worsening pain and swelling in his right leg associated with shortness of breath Patient had a CT angiogram which showed bilateral central pulmonary emboli. Right lower extremity venous Doppler shows extensive occlusive DVT throughout the right lower extremity extending from the common femoral vein into the calf. Patient was started on heparin drip in the ER and vascular surgery has been consulted.  He will be kept n.p.o. past midnight for possible thrombectomy in a.m.  Review of Systems: As per HPI otherwise 10 point review of systems negative.    Past Medical History:  Diagnosis Date  . DVT (deep venous thrombosis) (HCC) 2020   right LE    Past Surgical History:  Procedure Laterality Date  . HERNIA REPAIR  2009  . VASECTOMY  1999     reports that he has never smoked. He has never used smokeless tobacco. He reports current alcohol use. He reports that he does not use drugs.  No Known Allergies  No family history on file.   Prior to Admission medications   Medication Sig Start Date End Date Taking? Authorizing Provider  naproxen sodium (ALEVE) 220 MG tablet Take 220-440 mg by mouth 2 (two) times daily as needed (pain).   Yes [provider]  apixaban (ELIQUIS) 5 MG TABS tablet Take 1 tablet (5 mg total) by mouth 2 (two) times daily. Patient not taking: Reported on 04/17/2020 03/13/20   Rickard Patience, MD    Physical Exam: Vitals:   04/17/20 0911 04/17/20  0913 04/17/20 1307  BP: 107/88  125/85  Pulse: 95  78  Resp: 17  16  Temp: 97.9 F (36.6 C)    TempSrc: Oral    SpO2: 97%  100%  Weight:  93 kg   Height:  6\' 2"  (1.88 m)      Vitals:   04/17/20 0911 04/17/20 0913 04/17/20 1307  BP: 107/88  125/85  Pulse: 95  78  Resp: 17  16  Temp: 97.9 F (36.6 C)    TempSrc: Oral    SpO2: 97%  100%  Weight:  93 kg   Height:  6\' 2"  (1.88 m)     Constitutional:  NAD, alert and oriented x 3 Eyes: PERRL, lids and conjunctivae pallor ENMT: Mucous membranes are moist.  Neck: normal, supple, no masses, no thyromegaly Respiratory: clear to auscultation bilaterally, no wheezing, no crackles. Normal respiratory effort. No accessory muscle use.  Cardiovascular: Regular rate and rhythm,no murmurs / rubs / gallops. No extremity edema. 2+ pedal pulses. No carotid bruits.  Abdomen: no tenderness, no masses palpated. No hepatosplenomegaly. Bowel sounds positive.  Musculoskeletal: no clubbing / cyanosis. No joint deformity upper and lower extremities.  Swelling involving the right calf (Rt >> Lt) Skin: no rashes, lesions, ulcers.  Neurologic: No gross focal neurologic deficit. Psychiatric: Normal mood and affect.   Labs on Admission: I have personally reviewed following labs and imaging studies  CBC: Recent Labs  Lab 04/17/20 1034  WBC 10.6*  HGB 14.1  HCT 39.9  MCV 89.7  PLT 129*   Basic Metabolic Panel: Recent Labs  Lab 04/17/20 1034  NA 137  K 3.9  CL 103  CO2 26  GLUCOSE 109*  BUN 16  CREATININE 1.28*  CALCIUM 9.0   GFR: Estimated Creatinine Clearance: 82.9 mL/min (A) (by C-G formula based on SCr of 1.28 mg/dL (H)). Liver Function Tests: Recent Labs  Lab 04/17/20 1034  AST 37  ALT 40  ALKPHOS 56  BILITOT 1.1  PROT 7.4  ALBUMIN 4.4   No results for input(s): LIPASE, AMYLASE in the last 168 hours. No results for input(s): AMMONIA in the last 168 hours. Coagulation Profile: Recent Labs  Lab 04/17/20 1236  INR 1.1   Cardiac Enzymes: No results for input(s): CKTOTAL, CKMB, CKMBINDEX, TROPONINI in the last 168 hours. BNP (last 3 results) No results for input(s): PROBNP in the last 8760 hours. HbA1C: No results for input(s): HGBA1C in the last 72 hours. CBG: No results for input(s): GLUCAP in the last 168 hours. Lipid Profile: No results for input(s): CHOL, HDL, LDLCALC, TRIG, CHOLHDL, LDLDIRECT in the last 72  hours. Thyroid Function Tests: No results for input(s): TSH, T4TOTAL, FREET4, T3FREE, THYROIDAB in the last 72 hours. Anemia Panel: No results for input(s): VITAMINB12, FOLATE, FERRITIN, TIBC, IRON, RETICCTPCT in the last 72 hours. Urine analysis: No results found for: COLORURINE, APPEARANCEUR, LABSPEC, PHURINE, GLUCOSEU, HGBUR, BILIRUBINUR, KETONESUR, PROTEINUR, UROBILINOGEN, NITRITE, LEUKOCYTESUR  Radiological Exams on Admission: CT Angio Chest PE W and/or Wo Contrast  Result Date: 04/17/2020 CLINICAL DATA:  Right lower extremity DVT. Clinical suspicion for pulmonary embolus. EXAM: CT ANGIOGRAPHY CHEST WITH CONTRAST TECHNIQUE: Multidetector CT imaging of the chest was performed using the standard protocol during bolus administration of intravenous contrast. Multiplanar CT image reconstructions and MIPs were obtained to evaluate the vascular anatomy. CONTRAST:  06/17/20 OMNIPAQUE IOHEXOL 350 MG/ML SOLN COMPARISON:  None. FINDINGS: Cardiovascular: Satisfactory opacification of the pulmonary arteries. Bilateral central pulmonary emboli are present with the most central embolus within  the left main pulmonary artery. The thrombus becomes nearly occlusive at the bifurcation of the lingula and left lower lobe lobar arteries. Near occlusive pulmonary embolus is also present in the distal right main pulmonary artery, extending to the lobar branches of the upper and lower lobe. There is right heart strain with RV / LV ratio of 1.4. Mediastinum/Nodes: No enlarged mediastinal, hilar, or axillary lymph nodes. Thyroid gland, trachea, and esophagus demonstrate no significant findings. Lungs/Pleura: Lungs are clear. No pleural effusion or pneumothorax. Upper Abdomen: No acute abnormality. Musculoskeletal: No chest wall abnormality. No acute or significant osseous findings. Review of the MIP images confirms the above findings. IMPRESSION: 1. Bilateral central pulmonary emboli with the most central embolus within the left  main pulmonary artery. The thrombus becomes nearly occlusive at the bifurcation of the lingula and left lower lobe lobar arteries. 2. Near occlusive pulmonary embolus in the distal right main pulmonary artery, extending to the lobar branches of the upper and lower lobes. 3. Right heart strain with RV / LV ratio of 1.4. 4. Normal appearance of the lungs. Critical Value/emergent results were called by telephone at the time of interpretation on 04/17/2020 at 12:15 pm to Dr. Minna Antis , who verbally acknowledged these results. Electronically Signed   By: Ted Mcalpine M.D.   On: 04/17/2020 12:17   US Venous Img Lower Unilateral Right  Result Date: 04/17/2020 CLINICAL DATA:  DVT with increased pain and swelling. History of DVT on Eliquis, but ran out of medication recently. Recent travel. EXAM: RIGHT LOWER EXTREMITY VENOUS DOPPLER ULTRASOUND TECHNIQUE: Gray-scale sonography with compression, as well as color and duplex ultrasound, were performed to evaluate the deep venous system(s) from the level of the common femoral vein through the popliteal and proximal calf veins. COMPARISON:  None. FINDINGS: Nonocclusive clot at the common femoral vein on the right, mobile in the lumen on the cine clip. Occlusive clot with venous expansion at the right femoral vein, popliteal vein, and also involving the deep calf veins. Phasic flow is seen at the left common femoral vein which is patent. These results were called by telephone at the time of interpretation on 04/17/2020 at 10:09 am to provider [facility] , who verbally acknowledged these results. IMPRESSION: Extensive occlusive DVT throughout the right lower extremity extending from the common femoral vein into the calf. Electronically Signed   By: Marnee Spring M.D.   On: 04/17/2020 10:11    EKG: Independently reviewed.   Assessment/Plan Principal Problem:   Acute pulmonary embolism (HCC) Active Problems:   Acute deep vein thrombosis (DVT) of  popliteal vein of right lower extremity (HCC)      Acute pulmonary embolism/right lower extremity DVT Patient with a history of right lower extremity DVT that was diagnosed in April 2021 who was noncompliant with prescribed anticoagulant therapy He presents to the ER for evaluation of worsening right lower extremity swelling and pain and CT angiogram shows bilateral pulmonary emboli with right heart strain Place patient on heparin drip per protocol Consult vascular surgery for possible thrombectomy Obtain 2D echocardiogram to assess LV  DVT prophylaxis: Heparin Code Status: Full code Family Communication: Greater than 50% of time was spent discussing patient's condition and plan of care with him at the bedside.  All questions and concerns have been addressed.  He verbalizes understanding and agrees with the plan. Disposition Plan: Back to previous home environment Consults called: Vascular surgery    Fronia Depass MD [facility]     04/17/2020, 2:06 PM

## 2020-04-18 ENCOUNTER — Other Ambulatory Visit (INDEPENDENT_AMBULATORY_CARE_PROVIDER_SITE_OTHER): Payer: Self-pay | Admitting: Vascular Surgery

## 2020-04-18 DIAGNOSIS — I2699 Other pulmonary embolism without acute cor pulmonale: Secondary | ICD-10-CM

## 2020-04-18 DIAGNOSIS — I2609 Other pulmonary embolism with acute cor pulmonale: Secondary | ICD-10-CM

## 2020-04-18 DIAGNOSIS — I824Y1 Acute embolism and thrombosis of unspecified deep veins of right proximal lower extremity: Secondary | ICD-10-CM

## 2020-04-18 LAB — BASIC METABOLIC PANEL
Anion gap: 8 (ref 5–15)
BUN: 15 mg/dL (ref 6–20)
CO2: 26 mmol/L (ref 22–32)
Calcium: 9.3 mg/dL (ref 8.9–10.3)
Chloride: 104 mmol/L (ref 98–111)
Creatinine, Ser: 1.2 mg/dL (ref 0.61–1.24)
GFR calc Af Amer: >60 mL/min (ref >60)
GFR calc non Af Amer: >60 mL/min (ref >60)
Glucose, Bld: 106 mg/dL — ABNORMAL HIGH (ref 70–99)
Potassium: 4.3 mmol/L (ref 3.5–5.1)
Sodium: 138 mmol/L (ref 135–145)

## 2020-04-18 LAB — CBC
HCT: 40.9 % (ref 39.0–52.0)
Hemoglobin: 14.2 g/dL (ref 13.0–17.0)
MCH: 31.1 pg (ref 26.0–34.0)
MCHC: 34.7 g/dL (ref 30.0–36.0)
MCV: 89.7 fL (ref 80.0–100.0)
Platelets: 133 10*3/uL — ABNORMAL LOW (ref 150–400)
RBC: 4.56 MIL/uL (ref 4.22–5.81)
RDW: 12.2 % (ref 11.5–15.5)
WBC: 10 10*3/uL (ref 4.0–10.5)
nRBC: 0 % (ref 0.0–0.2)

## 2020-04-18 LAB — HEPARIN LEVEL (UNFRACTIONATED): Heparin Unfractionated: 0.35 IU/mL (ref 0.30–0.70)

## 2020-04-18 MED ORDER — APIXABAN 5 MG PO TABS
5.0000 mg | ORAL_TABLET | Freq: Two times a day (BID) | ORAL | 11 refills | Status: DC
Start: 2020-05-18 — End: 2020-11-29

## 2020-04-18 MED ORDER — APIXABAN (ELIQUIS) VTE STARTER PACK (10MG AND 5MG): ORAL_TABLET | ORAL | 0 refills | Status: DC

## 2020-04-18 MED ORDER — APIXABAN 5 MG PO TABS
10.0000 mg | ORAL_TABLET | Freq: Once | ORAL | Status: AC
  Administered 2020-04-18: 10 mg via ORAL
  Filled 2020-04-18: qty 2

## 2020-04-18 NOTE — ED Notes (Signed)
Spoke to [place] w/TOC. She will check on Eliquis card status/eligibility and come speak to pt

## 2020-04-18 NOTE — ED Notes (Signed)
Patient verbalizes understanding of discharge instructions. Opportunity for questioning and answers were provided. Armband removed by staff, pt discharged from ED. Ambulated out to lobby  

## 2020-04-18 NOTE — Progress Notes (Signed)
ANTICOAGULATION CONSULT NOTE - Initial Consult  Pharmacy Consult for Heparin infusion  Indication: pulmonary embolus and DVT  No Known Allergies  Patient Measurements: Height: 6\' 2"  (188 cm) Weight: 93 kg (205 lb) IBW/kg (Calculated) : 82.2  Vital Signs: BP: 99/76 (09/07 0400) Pulse Rate: 78 (09/07 0400)  Labs: Recent Labs    04/17/20 1034 04/17/20 1236 04/17/20 1905 04/18/20 0407  HGB 14.1  --   --  14.2  HCT 39.9  --   --  40.9  PLT 129*  --   --  133*  APTT  --  29  --   --   LABPROT  --  13.4  --   --   INR  --  1.1  --   --   HEPARINUNFRC  --  <0.10* 2.78*  0.36 0.35  CREATININE 1.28*  --   --  1.20    Estimated Creatinine Clearance: 88.5 mL/min (by C-G formula based on SCr of 1.2 mg/dL).  Medical History: Past Medical History:  Diagnosis Date  . DVT (deep venous thrombosis) (HCC) 2020   right LE   Assessment: 49 yo male presented to ED with increased pain to right thigh. Patient had recent diagnosis of DVT, but ran of of Eliquis a couple weeks ago.    57 shows extensive occlusive DVT throughout the right lower extremity extending from the common femoral vein into the calf. CT Chest shows bilateral central pulmonary emboli. Pharmacy has been consulted for heparin infusion dosing and monitoring.   Goal of Therapy:  Heparin level 0.3-0.7 units/ml Monitor platelets by anticoagulation protocol: Yes   Plan:  09/07 @ 0400 HL 0.35 therapeutic. Will continue current rate and will recheck HL w/ am labs, CBC stable will continue to monitor.  11/07, PharmD, BCPS Clinical Pharmacist 04/18/2020 5:27 AM

## 2020-04-18 NOTE — Discharge Instructions (Addendum)
Vascular Surgery Discharge Instructions:  1) You are scheduled to undergo a pulmonary thrombectomy / thrombolysis with possible right lower extremity thrombectomy / thrombolysis with possible IVC filter placement with Dr. Wyn Quaker on April 20, 2020. 2) Please DO NOT stop your Eliquis for the procedure.  3) Your most recent Covid test on April 17, 2020 was negative.  Please quarantine until your procedure. 4) Please arrive at the medical mall to check in at 1 PM on April 20, 2020. 5) Please do not eat or drink after midnight the night before.  You can take any medications with a sip of water.

## 2020-04-18 NOTE — Discharge Summary (Signed)
Physician Discharge Summary  [place] Fant Griffiths SWV:[id number] DOB: 1970/12/25 DOA: 04/17/2020  PCP: Doreene Nest, NP  Admit date: 04/17/2020 Discharge date: 04/18/2020  Admitted From: Home  Disposition:  Home   Recommendations for Outpatient Follow-up:  1. Follow up with Vascular Surgery for thrombectomy as directed 2. Follow up with Heme or PCP in 1-2 weeks 3. Please obtain BMP/CBC in one week      Home Health: none  Equipment/Devices: None  Discharge Condition: Good  CODE STATUS: FULL Diet recommendation: Regular  Brief/Interim Summary: Eric Bolton is a 49 y.o. M with hx DVT, and factor V heterozygote who presented with recurrent pain in the right thigh and swelling.  In the ER, US showed extensive RIGHT DVT and CTA showed bilateral PE.  Started on heparin.         PRINCIPAL HOSPITAL DIAGNOSIS: Acute pulmonary embolism    Discharge Diagnoses:   Acute recurrent unprovoked VTE in patient with factor V Leiden Patient had first leg swelling issues 1 year ago, for several months, initially thought to be gout, resolved by itself.   Then in April 2021, developed recurrent pain/swelling, found to have DVT on [name], started on Eliquis.  Follow up with Heme in July 2021, plan was to by on full dose anticoagulation for 3-6 months, repeat US at 3 months, and likely continue Eliquis 2.5 BID indefinitely.  Prothombin mutation negative, but positive for factor V mutation.  Evaluated by Vascular surgery who recommended thrombectomy as an outpatient.  Patient remained stable overnight on heparin, was asymptomatic, on room air.  Transitioned back to Eliquis with load.  Discharged in good condition.                Discharge Instructions  Discharge Instructions    Diet - low sodium heart healthy   Complete by: As directed    Discharge instructions   Complete by: As directed    Take apixaban/Eliquis 10 mg twice daily for 7 days, then on 9/15 change to 5 mg twice  daily Take this indefinitely  Follow up with Dr. Cathie Hoops or your primary care doctor  Follow up with Dr. Wyn Quaker from vascular surgery as instructed   Increase activity slowly   Complete by: As directed      Allergies as of 04/18/2020   No Known Allergies     Medication List    STOP taking these medications   naproxen sodium 220 MG tablet Commonly known as: ALEVE     TAKE these medications   Apixaban Starter Pack (10mg  and 5mg ) Commonly known as: ELIQUIS STARTER PACK Take as directed on package: start with two-5mg  tablets twice daily for 7 days. On day 8, switch to one-5mg  tablet twice daily. What changed: You were already taking a medication with the same name, and this prescription was added. Make sure you understand how and when to take each.   apixaban 5 MG Tabs tablet Commonly known as: Eliquis Take 1 tablet (5 mg total) by mouth 2 (two) times daily. Start taking on: May 18, 2020 What changed: These instructions start on May 18, 2020. If you are unsure what to do until then, ask your doctor or other care provider.       Follow-up Information    May 20, 2020, MD Follow up on 04/20/2020.   Specialties: Vascular Surgery, Radiology, Interventional Cardiology Why: You are scheduled to undergo a procedure with Dr. Annice Needy on April 20, 2020. Contact information: [id number] [place] [phone]  Doreene Nest, NP. Schedule an appointment as soon as possible for a visit in 1 week(s).   Specialty: Internal Medicine Contact information: [address] [place] [address] [phone]              No Known Allergies  Consultations:  Vascular surgery   Procedures/Studies: CT Angio Chest PE W and/or Wo Contrast  Result Date: 04/17/2020 CLINICAL DATA:  Right lower extremity DVT. Clinical suspicion for pulmonary embolus. EXAM: CT ANGIOGRAPHY CHEST WITH CONTRAST TECHNIQUE: Multidetector CT imaging of the chest was performed using the  standard protocol during bolus administration of intravenous contrast. Multiplanar CT image reconstructions and MIPs were obtained to evaluate the vascular anatomy. CONTRAST:  OMNIPAQUE IOHEXOL 350 MG/ML SOLN COMPARISON:  None. FINDINGS: Cardiovascular: Satisfactory opacification of the pulmonary arteries. Bilateral central pulmonary emboli are present with the most central embolus within the left main pulmonary artery. The thrombus becomes nearly occlusive at the bifurcation of the lingula and left lower lobe lobar arteries. Near occlusive pulmonary embolus is also present in the distal right main pulmonary artery, extending to the lobar branches of the upper and lower lobe. There is right heart strain with RV / LV ratio of 1.4. Mediastinum/Nodes: No enlarged mediastinal, hilar, or axillary lymph nodes. Thyroid gland, trachea, and esophagus demonstrate no significant findings. Lungs/Pleura: Lungs are clear. No pleural effusion or pneumothorax. Upper Abdomen: No acute abnormality. Musculoskeletal: No chest wall abnormality. No acute or significant osseous findings. Review of the MIP images confirms the above findings. IMPRESSION: 1. Bilateral central pulmonary emboli with the most central embolus within the left main pulmonary artery. The thrombus becomes nearly occlusive at the bifurcation of the lingula and left lower lobe lobar arteries. 2. Near occlusive pulmonary embolus in the distal right main pulmonary artery, extending to the lobar branches of the upper and lower lobes. 3. Right heart strain with RV / LV ratio of 1.4. 4. Normal appearance of the lungs. Critical Value/emergent results were called by telephone at the time of interpretation on 04/17/2020 at 12:15 pm to Dr. Minna Antis , who verbally acknowledged these results. Electronically Signed   By: Ted Mcalpine M.D.   On: 04/17/2020 12:17   US Venous Img Lower Unilateral Right  Result Date: 04/17/2020 CLINICAL DATA:  DVT with  increased pain and swelling. History of DVT on Eliquis, but ran out of medication recently. Recent travel. EXAM: RIGHT LOWER EXTREMITY VENOUS DOPPLER ULTRASOUND TECHNIQUE: Gray-scale sonography with compression, as well as color and duplex ultrasound, were performed to evaluate the deep venous system(s) from the level of the common femoral vein through the popliteal and proximal calf veins. COMPARISON:  None. FINDINGS: Nonocclusive clot at the common femoral vein on the right, mobile in the lumen on the cine clip. Occlusive clot with venous expansion at the right femoral vein, popliteal vein, and also involving the deep calf veins. Phasic flow is seen at the left common femoral vein which is patent. These results were called by telephone at the time of interpretation on 04/17/2020 at 10:09 am to provider [facility] , who verbally acknowledged these results. IMPRESSION: Extensive occlusive DVT throughout the right lower extremity extending from the common femoral vein into the calf. Electronically Signed   By: Marnee Spring M.D.   On: 04/17/2020 10:11       Subjective: Feeling well.  Mild right leg pain, swelling.  nof ever, no hemopytsis, no confusion, no syncope, no chest pain, no malaise.  Discharge Exam: Vitals:  04/18/20 1000 04/18/20 1145  BP: 104/69 96/68  Pulse: 75 67  Resp: (!) 25 (!) 21  Temp:    SpO2: 96% 97%   Vitals:   04/18/20 0930 04/18/20 0945 04/18/20 1000 04/18/20 1145  BP: 111/80  104/69 96/68  Pulse: 62 77 75 67  Resp: 14 (!) 22 (!) 25 (!) 21  Temp:      TempSrc:      SpO2: 95% 99% 96% 97%  Weight:      Height:        General: Pt is alert, awake, not in acute distress Cardiovascular: RRR, nl S1-S2, no murmurs appreciated.   No LE edema.   Respiratory: Normal respiratory rate and rhythm.  CTAB without rales or wheezes. Abdominal: Abdomen soft and non-tender.  No distension or HSM.   Neuro/Psych: Strength symmetric in upper and lower extremities.  Judgment  and insight appear normal.   The results of significant diagnostics from this hospitalization (including imaging, microbiology, ancillary and laboratory) are listed below for reference.     Microbiology: Recent Results (from the past 240 hour(s))  SARS Coronavirus 2 by RT PCR (hospital order, performed in [facility] hospital lab) Nasopharyngeal Nasopharyngeal Swab     Status: None   Collection Time: 04/17/20 10:34 AM   Specimen: Nasopharyngeal Swab  Result Value Ref Range Status   SARS Coronavirus 2 NEGATIVE NEGATIVE Final    Comment: (NOTE) SARS-CoV-2 target nucleic acids are NOT DETECTED.  The SARS-CoV-2 RNA is generally detectable in upper and lower respiratory specimens during the acute phase of infection. The lowest concentration of SARS-CoV-2 viral copies this assay can detect is 250 copies / mL. A negative result does not preclude SARS-CoV-2 infection and should not be used as the sole basis for treatment or other patient management decisions.  A negative result may occur with improper specimen collection / handling, submission of specimen other than nasopharyngeal swab, presence of viral mutation(s) within the areas targeted by this assay, and inadequate number of viral copies (<250 copies / mL). A negative result must be combined with clinical observations, patient history, and epidemiological information.  Fact Sheet for Patients:   BoilerBrush.com.cy  Fact Sheet for Healthcare Providers: https://pope.com/  This test is not yet approved or  cleared by the [place] FDA and has been authorized for detection and/or diagnosis of SARS-CoV-2 by FDA under an Emergency Use Authorization (EUA).  This EUA will remain in effect (meaning this test can be used) for the duration of the COVID-19 declaration under Section 564(b)(1) of the Act, 21 U.S.C. section 360bbb-3(b)(1), unless the authorization is terminated or revoked  sooner.  Performed at [facility], [address] [place], [place] [address]      Labs: BNP (last 3 results) No results for input(s): BNP in the last 8760 hours. Basic Metabolic Panel: Recent Labs  Lab 04/17/20 1034 04/18/20 0407  NA 137 138  K 3.9 4.3  CL 103 104  CO2 26 26  GLUCOSE 109* 106*  BUN 16 15  CREATININE 1.28* 1.20  CALCIUM 9.0 9.3   Liver Function Tests: Recent Labs  Lab 04/17/20 1034  AST 37  ALT 40  ALKPHOS 56  BILITOT 1.1  PROT 7.4  ALBUMIN 4.4   No results for input(s): LIPASE, AMYLASE in the last 168 hours. No results for input(s): AMMONIA in the last 168 hours. CBC: Recent Labs  Lab 04/17/20 1034 04/18/20 0407  WBC 10.6* 10.0  HGB 14.1 14.2  HCT 39.9 40.9  MCV 89.7 89.7  PLT 129* 133*   Cardiac Enzymes: No results for input(s): CKTOTAL, CKMB, CKMBINDEX, TROPONINI in the last 168 hours. BNP: Invalid input(s): POCBNP CBG: No results for input(s): GLUCAP in the last 168 hours. D-Dimer No results for input(s): DDIMER in the last 72 hours. Hgb A1c No results for input(s): HGBA1C in the last 72 hours. Lipid Profile No results for input(s): CHOL, HDL, LDLCALC, TRIG, CHOLHDL, LDLDIRECT in the last 72 hours. Thyroid function studies No results for input(s): TSH, T4TOTAL, T3FREE, THYROIDAB in the last 72 hours.  Invalid input(s): FREET3 Anemia work up No results for input(s): VITAMINB12, FOLATE, FERRITIN, TIBC, IRON, RETICCTPCT in the last 72 hours. Urinalysis No results found for: COLORURINE, APPEARANCEUR, LABSPEC, PHURINE, GLUCOSEU, HGBUR, BILIRUBINUR, KETONESUR, PROTEINUR, UROBILINOGEN, NITRITE, LEUKOCYTESUR Sepsis Labs Invalid input(s): PROCALCITONIN,  WBC,  LACTICIDVEN Microbiology Recent Results (from the past 240 hour(s))  SARS Coronavirus 2 by RT PCR (hospital order, performed in [facility] lab) Nasopharyngeal Nasopharyngeal Swab     Status: None   Collection Time: 04/17/20 10:34 AM   Specimen:  Nasopharyngeal Swab  Result Value Ref Range Status   SARS Coronavirus 2 NEGATIVE NEGATIVE Final    Comment: (NOTE) SARS-CoV-2 target nucleic acids are NOT DETECTED.  The SARS-CoV-2 RNA is generally detectable in upper and lower respiratory specimens during the acute phase of infection. The lowest concentration of SARS-CoV-2 viral copies this assay can detect is 250 copies / mL. A negative result does not preclude SARS-CoV-2 infection and should not be used as the sole basis for treatment or other patient management decisions.  A negative result may occur with improper specimen collection / handling, submission of specimen other than nasopharyngeal swab, presence of viral mutation(s) within the areas targeted by this assay, and inadequate number of viral copies (<250 copies / mL). A negative result must be combined with clinical observations, patient history, and epidemiological information.  Fact Sheet for Patients:   BoilerBrush.com.cyhttps://www.fda.gov/media/136312/download  Fact Sheet for Healthcare Providers: https://pope.com/https://www.fda.gov/media/136313/download  This test is not yet approved or  cleared by the [place] FDA and has been authorized for detection and/or diagnosis of SARS-CoV-2 by FDA under an Emergency Use Authorization (EUA).  This EUA will remain in effect (meaning this test can be used) for the duration of the COVID-19 declaration under Section 564(b)(1) of the Act, 21 U.S.C. section 360bbb-3(b)(1), unless the authorization is terminated or revoked sooner.  Performed at [facility], [address] [place], [place] [id number]      Time coordinating discharge: 25 minutes       SIGNED:   Alberteen Samhristopher P Kaelyn Nauta, MD  [facility] 04/18/2020, 8:27 PM

## 2020-04-18 NOTE — Consult Note (Signed)
 [facility] Vascular Consult Note  MRN : [id number]  Eric Bolton is a 49 y.o. (03/25/1971) male who presents with chief complaint of  Chief Complaint  Patient presents with  . Leg Pain    DVT   History of Present Illness: Eric Bolton is a 49 year old male with medical history significant for right lower extremity DVT diagnosed about in April 2021 who presents to the ER for worsening pain and swelling in his right leg associated with exertional shortness of breath and fatigue. Patient was discharged home on Eliquis after his initial diagnosis of right lower extremity DVT and received a 90-day supply.  He states he ran out of his Eliquis approximately a month ago and one day prior to his admission he developed worsening pain and swelling in his right lower extremity.  He noticed the pain after he drove 8 hours from [place].  He denies having any chest pain, no fever, no chills, no abdominal pain, no dizziness, no lightheadedness, no nausea or vomiting.  CTA Chest:  1. Bilateral central pulmonary emboli with the most central embolus within the left main pulmonary artery. The thrombus becomes nearly occlusive at the bifurcation of the lingula and left lower lobe lobar arteries. 2. Near occlusive pulmonary embolus in the distal right main pulmonary artery, extending to the lobar branches of the upper and lower lobes. 3. Right heart strain with RV / LV ratio of 1.4. 4. Normal appearance of the lungs.  Venous Duplex: Nonocclusive clot at the common femoral vein on the right, mobile in the lumen on the cine clip. Occlusive clot with venous expansion at the right femoral vein, popliteal vein, and also involving the deep calf veins. Phasic flow is seen at the left common femoral vein which is patent.  Vascular surgery was consulted in regard to the patient's pulmonary embolism and DVT.  Current Facility-Administered Medications  Medication Dose  Route Frequency Provider Last Rate Last Admin  . 0.9 %  sodium chloride infusion  250 mL Intravenous PRN Agbata, Tochukwu, MD      . acetaminophen (TYLENOL) tablet 650 mg  650 mg Oral Q6H PRN Agbata, Tochukwu, MD       Or  . acetaminophen (TYLENOL) suppository 650 mg  650 mg Rectal Q6H PRN Agbata, Tochukwu, MD      . apixaban (ELIQUIS) tablet 10 mg  10 mg Oral Once Danford, Christopher P, MD      . ondansetron (ZOFRAN) tablet 4 mg  4 mg Oral Q6H PRN Agbata, Tochukwu, MD       Or  . ondansetron (ZOFRAN) injection 4 mg  4 mg Intravenous Q6H PRN Agbata, Tochukwu, MD      . sodium chloride flush (NS) 0.9 % injection 3 mL  3 mL Intravenous Q12H Agbata, Tochukwu, MD   3 mL at 04/17/20 1314  . sodium chloride flush (NS) 0.9 % injection 3 mL  3 mL Intravenous PRN Agbata, Tochukwu, MD       Current Outpatient Medications  Medication Sig Dispense Refill  . [START ON 05/18/2020] apixaban (ELIQUIS) 5 MG TABS tablet Take 1 tablet (5 mg total) by mouth 2 (two) times daily. 60 tablet 11  . APIXABAN (ELIQUIS) VTE STARTER PACK (10MG AND 5MG) Take as directed on package: start with two-5mg tablets twice daily for 7 days. On day 8, switch to one-5mg tablet twice daily. 1 each 0   Past Medical History:  Diagnosis Date  . DVT (deep venous thrombosis) (HCC)   2020   right LE   Past Surgical History:  Procedure Laterality Date  . HERNIA REPAIR  2009  . VASECTOMY  1999   Social History Social History   Tobacco Use  . Smoking status: Never Smoker  . Smokeless tobacco: Never Used  Substance Use Topics  . Alcohol use: Yes    Comment: once a week  . Drug use: No   Family History No family history on file.  No Known Allergies  REVIEW OF SYSTEMS (Negative unless checked)  Constitutional: []Weight loss  []Fever  []Chills Cardiac: []Chest pain   []Chest pressure   []Palpitations   []Shortness of breath when laying flat   []Shortness of breath at rest   [x]Shortness of breath with exertion. Vascular:   []Pain in legs with walking   []Pain in legs at rest   []Pain in legs when laying flat   []Claudication   []Pain in feet when walking  []Pain in feet at rest  []Pain in feet when laying flat   [x]History of DVT   []Phlebitis   [x]Swelling in legs   []Varicose veins   []Non-healing ulcers Pulmonary:   []Uses home oxygen   []Productive cough   []Hemoptysis   []Wheeze  []COPD   []Asthma Neurologic:  []Dizziness  []Blackouts   []Seizures   []History of stroke   []History of TIA  []Aphasia   []Temporary blindness   []Dysphagia   []Weakness or numbness in arms   []Weakness or numbness in legs Musculoskeletal:  []Arthritis   []Joint swelling   []Joint pain   []Low back pain Hematologic:  []Easy bruising  []Easy bleeding   [x]Hypercoagulable state   []Anemic  []Hepatitis Gastrointestinal:  []Blood in stool   []Vomiting blood  []Gastroesophageal reflux/heartburn   []Difficulty swallowing. Genitourinary:  []Chronic kidney disease   []Difficult urination  []Frequent urination  []Burning with urination   []Blood in urine Skin:  []Rashes   []Ulcers   []Wounds Psychological:  []History of anxiety   [] History of major depression.  Physical Examination  Vitals:   04/18/20 0915 04/18/20 0930 04/18/20 0945 04/18/20 1000  BP:  111/80  104/69  Pulse: 66 62 77 75  Resp: (!) 22 14 (!) 22 (!) 25  Temp:      TempSrc:      SpO2: 95% 95% 99% 96%  Weight:      Height:       Body mass index is 26.32 kg/m. Gen:  WD/WN, NAD Head: [place]/AT, No temporalis wasting. Prominent temp pulse not noted. Ear/Nose/Throat: Hearing grossly intact, nares w/o erythema or drainage, oropharynx w/o Erythema/Exudate Eyes: Sclera non-icteric, conjunctiva clear Neck: Trachea midline.  No JVD.  Pulmonary:  Good air movement, respirations not labored, equal bilaterally.  Cardiac: RRR, normal S1, S2. Vascular:  Vessel Right Left  Radial Palpable Palpable  Ulnar Palpable Palpable  Brachial Palpable Palpable  Carotid Palpable, without  bruit Palpable, without bruit  Aorta Not palpable N/A  Femoral Palpable Palpable  Popliteal Palpable Palpable  PT Palpable Palpable  DP Palpable Palpable   Right lower extremity: Thigh soft.  Calf soft.  There is no acute vascular compromise noted to extremity at this time.  Mild edema.  No erythema.  Motor/sensory is intact.  Gastrointestinal: soft, non-tender/non-distended. No guarding/reflex.  Musculoskeletal: M/S 5/5 throughout.  Extremities without ischemic changes.  No deformity or atrophy. No edema. Neurologic: Sensation grossly intact in extremities.  Symmetrical.  Speech is fluent. Motor exam as listed above. Psychiatric: Judgment intact, Mood &   affect appropriate for pt's clinical situation. Dermatologic: No rashes or ulcers noted.  No cellulitis or open wounds. Lymph : No Cervical, Axillary, or Inguinal lymphadenopathy.  CBC Lab Results  Component Value Date   WBC 10.0 04/18/2020   HGB 14.2 04/18/2020   HCT 40.9 04/18/2020   MCV 89.7 04/18/2020   PLT 133 (L) 04/18/2020   BMET    Component Value Date/Time   NA 138 04/18/2020 0407   K 4.3 04/18/2020 0407   CL 104 04/18/2020 0407   CO2 26 04/18/2020 0407   GLUCOSE 106 (H) 04/18/2020 0407   BUN 15 04/18/2020 0407   CREATININE 1.20 04/18/2020 0407   CALCIUM 9.3 04/18/2020 0407   GFRNONAA >60 04/18/2020 0407   GFRAA >60 04/18/2020 0407   Estimated Creatinine Clearance: 88.5 mL/min (by C-G formula based on SCr of 1.2 mg/dL).  COAG Lab Results  Component Value Date   INR 1.1 04/17/2020   Radiology CT Angio Chest PE W and/or Wo Contrast  Result Date: 04/17/2020 CLINICAL DATA:  Right lower extremity DVT. Clinical suspicion for pulmonary embolus. EXAM: CT ANGIOGRAPHY CHEST WITH CONTRAST TECHNIQUE: Multidetector CT imaging of the chest was performed using the standard protocol during bolus administration of intravenous contrast. Multiplanar CT image reconstructions and MIPs were obtained to evaluate the vascular  anatomy. CONTRAST:  100mL OMNIPAQUE IOHEXOL 350 MG/ML SOLN COMPARISON:  None. FINDINGS: Cardiovascular: Satisfactory opacification of the pulmonary arteries. Bilateral central pulmonary emboli are present with the most central embolus within the left main pulmonary artery. The thrombus becomes nearly occlusive at the bifurcation of the lingula and left lower lobe lobar arteries. Near occlusive pulmonary embolus is also present in the distal right main pulmonary artery, extending to the lobar branches of the upper and lower lobe. There is right heart strain with RV / LV ratio of 1.4. Mediastinum/Nodes: No enlarged mediastinal, hilar, or axillary lymph nodes. Thyroid gland, trachea, and esophagus demonstrate no significant findings. Lungs/Pleura: Lungs are clear. No pleural effusion or pneumothorax. Upper Abdomen: No acute abnormality. Musculoskeletal: No chest wall abnormality. No acute or significant osseous findings. Review of the MIP images confirms the above findings. IMPRESSION: 1. Bilateral central pulmonary emboli with the most central embolus within the left main pulmonary artery. The thrombus becomes nearly occlusive at the bifurcation of the lingula and left lower lobe lobar arteries. 2. Near occlusive pulmonary embolus in the distal right main pulmonary artery, extending to the lobar branches of the upper and lower lobes. 3. Right heart strain with RV / LV ratio of 1.4. 4. Normal appearance of the lungs. Critical Value/emergent results were called by telephone at the time of interpretation on 04/17/2020 at 12:15 pm to Dr. KEVIN PADUCHOWSKI , who verbally acknowledged these results. Electronically Signed   By: Dobrinka  Dimitrova M.D.   On: 04/17/2020 12:17   US Venous Img Lower Unilateral Right  Result Date: 04/17/2020 CLINICAL DATA:  DVT with increased pain and swelling. History of DVT on Eliquis, but ran out of medication recently. Recent travel. EXAM: RIGHT LOWER EXTREMITY VENOUS DOPPLER ULTRASOUND  TECHNIQUE: Gray-scale sonography with compression, as well as color and duplex ultrasound, were performed to evaluate the deep venous system(s) from the level of the common femoral vein through the popliteal and proximal calf veins. COMPARISON:  None. FINDINGS: Nonocclusive clot at the common femoral vein on the right, mobile in the lumen on the cine clip. Occlusive clot with venous expansion at the right femoral vein, popliteal vein, and also involving the deep calf veins.   Phasic flow is seen at the left common femoral vein which is patent. These results were called by telephone at the time of interpretation on 04/17/2020 at 10:09 am to provider SEBASTIAN SIADECKI , who verbally acknowledged these results. IMPRESSION: Extensive occlusive DVT throughout the right lower extremity extending from the common femoral vein into the calf. Electronically Signed   By: Jonathon  Watts M.D.   On: 04/17/2020 10:11   Assessment/Plan The patient is a 48-year-old male with known history of DVT to the right lower extremity on Eliquis.  Unfortunately, the discontinued Eliquis when his prescription ran out and presents to the [facility]'s emergency department with worsening right lower extremity DVT and PE  1.  Pulmonary embolism: CTA notable for: 1. Bilateral central pulmonary emboli with the most central embolus within the left main pulmonary artery. The thrombus becomes nearly occlusive at the bifurcation of the lingula and left lower lobe lobar arteries. 2. Near occlusive pulmonary embolus in the distal right main pulmonary artery, extending to the lobar branches of the upper and lower lobes. 3. Right heart strain with RV / LV ratio of 1.4.  Patient is relatively asymptomatic at rest however becomes short of breath with ambulation.  Patient does note some improvement since heparin has been initiated.  Due to the patient's large clot burden, symptoms which worsen with ambulation and right heart  strain recommend a pulmonary thrombectomy/thrombolysis.  Transition to Eliquis with loading dose.  We will plan on procedure on Thursday as an outpatient with Dr. Dew.  Procedure, risks and benefits went to the patient and his family at the bedside.  All questions were answered.  Patient wished to proceed.  2.  Right lower extremity DVT: Patient with known history of right lower extremity DVT however stopped his Eliquis when his prescription ran out.  Patient now presents with worsening right lower extremity DVT.  Surprisingly, the patient's physical exam is relatively unremarkable.  This certainly may change once the patient is more ambulatory.  Would consider also a right lower extremity venous thrombectomy/thrombolysis if the patient notes significant worsening in edema.  Procedure, risks and benefits were explained to the patient and his family with site.  All questions were answered.  Patient was to proceed.  3.  Anticoagulation: Recommend transition to Eliquis with loading dose 10 mg twice daily followed by 5 mg twice daily. Had a long conversation with the patient and his family member at the bedside to the absolute need for compliance. He expresses his understanding  Discussed with Dr. Dew / Schnier  Eric Mcelhiney A Alexxis Mackert, PA-C  04/18/2020 11:31 AM  This note was created with Dragon medical transcription system.  Any error is purely unintentional. 

## 2020-04-18 NOTE — ED Notes (Signed)
Pt ok to eat, and d/c per Dr. Maryfrances Bunnell.

## 2020-04-18 NOTE — H&P (View-Only) (Signed)
[facility] Vascular Consult Note  MRN : [id number]  Eric Bolton is a 49 y.o. (08/26/1970) male who presents with chief complaint of  Chief Complaint  Patient presents with  . Leg Pain    DVT   History of Present Illness: Eric Bolton is a 49 year old male with medical history significant for right lower extremity DVT diagnosed about in April 2021 who presents to the ER for worsening pain and swelling in his right leg associated with exertional shortness of breath and fatigue. Patient was discharged home on Eliquis after his initial diagnosis of right lower extremity DVT and received a 90-day supply.  He states he ran out of his Eliquis approximately a month ago and one day prior to his admission he developed worsening pain and swelling in his right lower extremity.  He noticed the pain after he drove 8 hours from [place].  He denies having any chest pain, no fever, no chills, no abdominal pain, no dizziness, no lightheadedness, no nausea or vomiting.  CTA Chest:  1. Bilateral central pulmonary emboli with the most central embolus within the left main pulmonary artery. The thrombus becomes nearly occlusive at the bifurcation of the lingula and left lower lobe lobar arteries. 2. Near occlusive pulmonary embolus in the distal right main pulmonary artery, extending to the lobar branches of the upper and lower lobes. 3. Right heart strain with RV / LV ratio of 1.4. 4. Normal appearance of the lungs.  Venous Duplex: Nonocclusive clot at the common femoral vein on the right, mobile in the lumen on the cine clip. Occlusive clot with venous expansion at the right femoral vein, popliteal vein, and also involving the deep calf veins. Phasic flow is seen at the left common femoral vein which is patent.  Vascular surgery was consulted in regard to the patient's pulmonary embolism and DVT.  Current Facility-Administered Medications  Medication Dose  Route Frequency Provider Last Rate Last Admin  . 0.9 %  sodium chloride infusion  250 mL Intravenous PRN Agbata, Tochukwu, MD      . acetaminophen (TYLENOL) tablet 650 mg  650 mg Oral Q6H PRN Agbata, Tochukwu, MD       Or  . acetaminophen (TYLENOL) suppository 650 mg  650 mg Rectal Q6H PRN Agbata, Tochukwu, MD      . apixaban (ELIQUIS) tablet 10 mg  10 mg Oral Once Danford, Earl Lites, MD      . ondansetron (ZOFRAN) tablet 4 mg  4 mg Oral Q6H PRN Agbata, Tochukwu, MD       Or  . ondansetron (ZOFRAN) injection 4 mg  4 mg Intravenous Q6H PRN Agbata, Tochukwu, MD      . sodium chloride flush (NS) 0.9 % injection 3 mL  3 mL Intravenous Q12H Agbata, Tochukwu, MD   3 mL at 04/17/20 1314  . sodium chloride flush (NS) 0.9 % injection 3 mL  3 mL Intravenous PRN Agbata, Tochukwu, MD       Current Outpatient Medications  Medication Sig Dispense Refill  . [START ON 05/18/2020] apixaban (ELIQUIS) 5 MG TABS tablet Take 1 tablet (5 mg total) by mouth 2 (two) times daily. 60 tablet 11  . APIXABAN (ELIQUIS) VTE STARTER PACK (10MG  AND 5MG ) Take as directed on package: start with two-5mg  tablets twice daily for 7 days. On day 8, switch to one-5mg  tablet twice daily. 1 each 0   Past Medical History:  Diagnosis Date  . DVT (deep venous thrombosis) (HCC)  2020   right LE   Past Surgical History:  Procedure Laterality Date  . HERNIA REPAIR  2009  . VASECTOMY  1999   Social History Social History   Tobacco Use  . Smoking status: Never Smoker  . Smokeless tobacco: Never Used  Substance Use Topics  . Alcohol use: Yes    Comment: once a week  . Drug use: No   Family History No family history on file.  No Known Allergies  REVIEW OF SYSTEMS (Negative unless checked)  Constitutional: [] Weight loss  [] Fever  [] Chills Cardiac: [] Chest pain   [] Chest pressure   [] Palpitations   [] Shortness of breath when laying flat   [] Shortness of breath at rest   [x] Shortness of breath with exertion. Vascular:   [] Pain in legs with walking   [] Pain in legs at rest   [] Pain in legs when laying flat   [] Claudication   [] Pain in feet when walking  [] Pain in feet at rest  [] Pain in feet when laying flat   [x] History of DVT   [] Phlebitis   [x] Swelling in legs   [] Varicose veins   [] Non-healing ulcers Pulmonary:   [] Uses home oxygen   [] Productive cough   [] Hemoptysis   [] Wheeze  [] COPD   [] Asthma Neurologic:  [] Dizziness  [] Blackouts   [] Seizures   [] History of stroke   [] History of TIA  [] Aphasia   [] Temporary blindness   [] Dysphagia   [] Weakness or numbness in arms   [] Weakness or numbness in legs Musculoskeletal:  [] Arthritis   [] Joint swelling   [] Joint pain   [] Low back pain Hematologic:  [] Easy bruising  [] Easy bleeding   [x] Hypercoagulable state   [] Anemic  [] Hepatitis Gastrointestinal:  [] Blood in stool   [] Vomiting blood  [] Gastroesophageal reflux/heartburn   [] Difficulty swallowing. Genitourinary:  [] Chronic kidney disease   [] Difficult urination  [] Frequent urination  [] Burning with urination   [] Blood in urine Skin:  [] Rashes   [] Ulcers   [] Wounds Psychological:  [] History of anxiety   []  History of major depression.  Physical Examination  Vitals:   04/18/20 0915 04/18/20 0930 04/18/20 0945 04/18/20 1000  BP:  111/80  104/69  Pulse: 66 62 77 75  Resp: (!) 22 14 (!) 22 (!) 25  Temp:      TempSrc:      SpO2: 95% 95% 99% 96%  Weight:      Height:       Body mass index is 26.32 kg/m. Gen:  WD/WN, NAD Head: [place]/AT, No temporalis wasting. Prominent temp pulse not noted. Ear/Nose/Throat: Hearing grossly intact, nares w/o erythema or drainage, oropharynx w/o Erythema/Exudate Eyes: Sclera non-icteric, conjunctiva clear Neck: Trachea midline.  No JVD.  Pulmonary:  Good air movement, respirations not labored, equal bilaterally.  Cardiac: RRR, normal S1, S2. Vascular:  Vessel Right Left  Radial Palpable Palpable  Ulnar Palpable Palpable  Brachial Palpable Palpable  Carotid Palpable, without  bruit Palpable, without bruit  Aorta Not palpable N/A  Femoral Palpable Palpable  Popliteal Palpable Palpable  PT Palpable Palpable  DP Palpable Palpable   Right lower extremity: Thigh soft.  Calf soft.  There is no acute vascular compromise noted to extremity at this time.  Mild edema.  No erythema.  Motor/sensory is intact.  Gastrointestinal: soft, non-tender/non-distended. No guarding/reflex.  Musculoskeletal: M/S 5/5 throughout.  Extremities without ischemic changes.  No deformity or atrophy. No edema. Neurologic: Sensation grossly intact in extremities.  Symmetrical.  Speech is fluent. Motor exam as listed above. Psychiatric: Judgment intact, Mood &  affect appropriate for pt's clinical situation. Dermatologic: No rashes or ulcers noted.  No cellulitis or open wounds. Lymph : No Cervical, Axillary, or Inguinal lymphadenopathy.  CBC Lab Results  Component Value Date   WBC 10.0 04/18/2020   HGB 14.2 04/18/2020   HCT 40.9 04/18/2020   MCV 89.7 04/18/2020   PLT 133 (L) 04/18/2020   BMET    Component Value Date/Time   NA 138 04/18/2020 0407   K 4.3 04/18/2020 0407   CL 104 04/18/2020 0407   CO2 26 04/18/2020 0407   GLUCOSE 106 (H) 04/18/2020 0407   BUN 15 04/18/2020 0407   CREATININE 1.20 04/18/2020 0407   CALCIUM 9.3 04/18/2020 0407   GFRNONAA >60 04/18/2020 0407   GFRAA >60 04/18/2020 0407   Estimated Creatinine Clearance: 88.5 mL/min (by C-G formula based on SCr of 1.2 mg/dL).  COAG Lab Results  Component Value Date   INR 1.1 04/17/2020   Radiology CT Angio Chest PE W and/or Wo Contrast  Result Date: 04/17/2020 CLINICAL DATA:  Right lower extremity DVT. Clinical suspicion for pulmonary embolus. EXAM: CT ANGIOGRAPHY CHEST WITH CONTRAST TECHNIQUE: Multidetector CT imaging of the chest was performed using the standard protocol during bolus administration of intravenous contrast. Multiplanar CT image reconstructions and MIPs were obtained to evaluate the vascular  anatomy. CONTRAST:  OMNIPAQUE IOHEXOL 350 MG/ML SOLN COMPARISON:  None. FINDINGS: Cardiovascular: Satisfactory opacification of the pulmonary arteries. Bilateral central pulmonary emboli are present with the most central embolus within the left main pulmonary artery. The thrombus becomes nearly occlusive at the bifurcation of the lingula and left lower lobe lobar arteries. Near occlusive pulmonary embolus is also present in the distal right main pulmonary artery, extending to the lobar branches of the upper and lower lobe. There is right heart strain with RV / LV ratio of 1.4. Mediastinum/Nodes: No enlarged mediastinal, hilar, or axillary lymph nodes. Thyroid gland, trachea, and esophagus demonstrate no significant findings. Lungs/Pleura: Lungs are clear. No pleural effusion or pneumothorax. Upper Abdomen: No acute abnormality. Musculoskeletal: No chest wall abnormality. No acute or significant osseous findings. Review of the MIP images confirms the above findings. IMPRESSION: 1. Bilateral central pulmonary emboli with the most central embolus within the left main pulmonary artery. The thrombus becomes nearly occlusive at the bifurcation of the lingula and left lower lobe lobar arteries. 2. Near occlusive pulmonary embolus in the distal right main pulmonary artery, extending to the lobar branches of the upper and lower lobes. 3. Right heart strain with RV / LV ratio of 1.4. 4. Normal appearance of the lungs. Critical Value/emergent results were called by telephone at the time of interpretation on 04/17/2020 at 12:15 pm to Dr. Minna Antis , who verbally acknowledged these results. Electronically Signed   By: Ted Mcalpine M.D.   On: 04/17/2020 12:17   US Venous Img Lower Unilateral Right  Result Date: 04/17/2020 CLINICAL DATA:  DVT with increased pain and swelling. History of DVT on Eliquis, but ran out of medication recently. Recent travel. EXAM: RIGHT LOWER EXTREMITY VENOUS DOPPLER ULTRASOUND  TECHNIQUE: Gray-scale sonography with compression, as well as color and duplex ultrasound, were performed to evaluate the deep venous system(s) from the level of the common femoral vein through the popliteal and proximal calf veins. COMPARISON:  None. FINDINGS: Nonocclusive clot at the common femoral vein on the right, mobile in the lumen on the cine clip. Occlusive clot with venous expansion at the right femoral vein, popliteal vein, and also involving the deep calf veins.  Phasic flow is seen at the left common femoral vein which is patent. These results were called by telephone at the time of interpretation on 04/17/2020 at 10:09 am to provider [facility] , who verbally acknowledged these results. IMPRESSION: Extensive occlusive DVT throughout the right lower extremity extending from the common femoral vein into the calf. Electronically Signed   By: Marnee Spring M.D.   On: 04/17/2020 10:11   Assessment/Plan The patient is a 49 year old male with known history of DVT to the right lower extremity on Eliquis.  Unfortunately, the discontinued Eliquis when his prescription ran out and presents to the [facility] emergency department with worsening right lower extremity DVT and PE  1.  Pulmonary embolism: CTA notable for: 1. Bilateral central pulmonary emboli with the most central embolus within the left main pulmonary artery. The thrombus becomes nearly occlusive at the bifurcation of the lingula and left lower lobe lobar arteries. 2. Near occlusive pulmonary embolus in the distal right main pulmonary artery, extending to the lobar branches of the upper and lower lobes. 3. Right heart strain with RV / LV ratio of 1.4.  Patient is relatively asymptomatic at rest however becomes short of breath with ambulation.  Patient does note some improvement since heparin has been initiated.  Due to the patient's large clot burden, symptoms which worsen with ambulation and right heart  strain recommend a pulmonary thrombectomy/thrombolysis.  Transition to Eliquis with loading dose.  We will plan on procedure on Thursday as an outpatient with Dr. Wyn Quaker.  Procedure, risks and benefits went to the patient and his family at the bedside.  All questions were answered.  Patient wished to proceed.  2.  Right lower extremity DVT: Patient with known history of right lower extremity DVT however stopped his Eliquis when his prescription ran out.  Patient now presents with worsening right lower extremity DVT.  Surprisingly, the patient's physical exam is relatively unremarkable.  This certainly may change once the patient is more ambulatory.  Would consider also a right lower extremity venous thrombectomy/thrombolysis if the patient notes significant worsening in edema.  Procedure, risks and benefits were explained to the patient and his family with site.  All questions were answered.  Patient was to proceed.  3.  Anticoagulation: Recommend transition to Eliquis with loading dose 10 mg twice daily followed by 5 mg twice daily. Had a long conversation with the patient and his family member at the bedside to the absolute need for compliance. He expresses his understanding  Discussed with Dr. Wyn Quaker / [place]  Tonette Lederer, PA-C  04/18/2020 11:31 AM  This note was created with Dragon medical transcription system.  Any error is purely unintentional.

## 2020-04-19 ENCOUNTER — Ambulatory Visit: Payer: 59

## 2020-04-19 MED ORDER — CEFAZOLIN SODIUM-DEXTROSE 2-4 GM/100ML-% IV SOLN: 2.0000 g | Freq: Once | INTRAVENOUS | Status: DC

## 2020-04-20 ENCOUNTER — Encounter: Payer: Self-pay | Admitting: Vascular Surgery

## 2020-04-20 ENCOUNTER — Encounter
Admission: RE | Disposition: A | Discharge status: Home / Self Care | Payer: Self-pay | Source: Home / Self Care | Attending: Vascular Surgery

## 2020-04-20 ENCOUNTER — Other Ambulatory Visit: Payer: Self-pay

## 2020-04-20 ENCOUNTER — Ambulatory Visit
Admission: RE | Admit: 2020-04-20 | Discharge: 2020-04-20 | Disposition: A | Discharge status: Home / Self Care | Payer: 59 | Attending: Vascular Surgery | Admitting: Vascular Surgery

## 2020-04-20 DIAGNOSIS — I82401 Acute embolism and thrombosis of unspecified deep veins of right lower extremity: Secondary | ICD-10-CM | POA: Insufficient documentation

## 2020-04-20 DIAGNOSIS — I82402 Acute embolism and thrombosis of unspecified deep veins of left lower extremity: Secondary | ICD-10-CM

## 2020-04-20 DIAGNOSIS — I2699 Other pulmonary embolism without acute cor pulmonale: Secondary | ICD-10-CM | POA: Insufficient documentation

## 2020-04-20 DIAGNOSIS — Z7901 Long term (current) use of anticoagulants: Secondary | ICD-10-CM | POA: Diagnosis not present

## 2020-04-20 HISTORY — PX: PULMONARY THROMBECTOMY: CATH118295

## 2020-04-20 SURGERY — PULMONARY THROMBECTOMY
Anesthesia: Moderate Sedation

## 2020-04-20 MED ORDER — DIPHENHYDRAMINE HCL 50 MG/ML IJ SOLN: 50.0000 mg | Freq: Once | INTRAMUSCULAR | Status: DC | PRN

## 2020-04-20 MED ORDER — IODIXANOL 320 MG/ML IV SOLN
INTRAVENOUS | Status: DC | PRN
  Administered 2020-04-20: 65 mL

## 2020-04-20 MED ORDER — ALTEPLASE 2 MG IJ SOLR
INTRAMUSCULAR | Status: DC | PRN
  Administered 2020-04-20: 4 mg
  Administered 2020-04-20: 8 mg

## 2020-04-20 MED ORDER — ONDANSETRON HCL 4 MG/2ML IJ SOLN: 4.0000 mg | Freq: Four times a day (QID) | INTRAMUSCULAR | Status: DC | PRN

## 2020-04-20 MED ORDER — FENTANYL CITRATE (PF) 100 MCG/2ML IJ SOLN
INTRAMUSCULAR | Status: DC | PRN
Start: 2020-04-20 — End: 2020-04-20
  Administered 2020-04-20 (×2): 50 ug via INTRAVENOUS

## 2020-04-20 MED ORDER — FENTANYL CITRATE (PF) 100 MCG/2ML IJ SOLN
INTRAMUSCULAR | Status: AC
  Filled 2020-04-20: qty 2

## 2020-04-20 MED ORDER — CLINDAMYCIN PHOSPHATE 300 MG/50ML IV SOLN
INTRAVENOUS | Status: AC
  Administered 2020-04-20: 300 mg via INTRAVENOUS
  Filled 2020-04-20: qty 50

## 2020-04-20 MED ORDER — SODIUM CHLORIDE 0.9 % IV SOLN: INTRAVENOUS | Status: DC

## 2020-04-20 MED ORDER — HEPARIN SODIUM (PORCINE) 1000 UNIT/ML IJ SOLN
INTRAMUSCULAR | Status: AC
  Filled 2020-04-20: qty 1

## 2020-04-20 MED ORDER — MIDAZOLAM HCL 2 MG/2ML IJ SOLN
INTRAMUSCULAR | Status: DC | PRN
  Administered 2020-04-20 (×2): 2 mg via INTRAVENOUS

## 2020-04-20 MED ORDER — MIDAZOLAM HCL 2 MG/ML PO SYRP: 8.0000 mg | ORAL_SOLUTION | Freq: Once | ORAL | Status: DC | PRN

## 2020-04-20 MED ORDER — HEPARIN SODIUM (PORCINE) 1000 UNIT/ML IJ SOLN
INTRAMUSCULAR | Status: DC | PRN
  Administered 2020-04-20: 4000 [IU] via INTRAVENOUS

## 2020-04-20 MED ORDER — FAMOTIDINE 20 MG PO TABS: 40.0000 mg | ORAL_TABLET | Freq: Once | ORAL | Status: DC | PRN

## 2020-04-20 MED ORDER — METHYLPREDNISOLONE SODIUM SUCC 125 MG IJ SOLR: 125.0000 mg | Freq: Once | INTRAMUSCULAR | Status: DC | PRN

## 2020-04-20 MED ORDER — MIDAZOLAM HCL 5 MG/5ML IJ SOLN
INTRAMUSCULAR | Status: AC
  Filled 2020-04-20: qty 5

## 2020-04-20 MED ORDER — CLINDAMYCIN PHOSPHATE 300 MG/50ML IV SOLN: 300.0000 mg | Freq: Once | INTRAVENOUS | Status: AC

## 2020-04-20 MED ORDER — HYDROMORPHONE HCL 1 MG/ML IJ SOLN: 1.0000 mg | Freq: Once | INTRAMUSCULAR | Status: DC | PRN

## 2020-04-20 SURGICAL SUPPLY — 13 items
CANISTER PENUMBRA ENGINE (MISCELLANEOUS) ×3 IMPLANT
CANNULA 5F STIFF (CANNULA) ×3 IMPLANT
CATH ANGIO 5F PIGTAIL 100CM (CATHETERS) ×3 IMPLANT
CATH INDIGO 12 HTORQ 115 (CATHETERS) ×3 IMPLANT
CATH INDIGO SEP 12 (CATHETERS) ×3 IMPLANT
CATH INFINITI JR4 5F (CATHETERS) ×3 IMPLANT
DEVICE SAFEGUARD 24CM (GAUZE/BANDAGES/DRESSINGS) ×3 IMPLANT
GLIDEWIRE ADV .035X260CM (WIRE) ×3 IMPLANT
PACK ANGIOGRAPHY (CUSTOM PROCEDURE TRAY) ×3 IMPLANT
SHEATH PINNACLE 11FRX10 (SHEATH) ×6 IMPLANT
TUBING CONTRAST HIGH PRESS 72 (TUBING) ×3 IMPLANT
WIRE J 3MM .035X145CM (WIRE) ×3 IMPLANT
WIRE MAGIC TORQUE 260C (WIRE) ×6 IMPLANT

## 2020-04-20 NOTE — Interval H&P Note (Signed)
History and Physical Interval Note:  04/20/2020 1:03 PM  Eric Bolton  has presented today for surgery, with the diagnosis of PE / DVT.  The various methods of treatment have been discussed with the patient and family. After consideration of risks, benefits and other options for treatment, the patient has consented to  Procedure(s): PULMONARY THROMBECTOMY / THROMBOLYSIS POSSIBLE RIGHT LOWER EXTREMITY THROMBECTOMY / THROMBOLYSIS / POSSIBLE IVC FILTER (N/A) as a surgical intervention.  The patient's history has been reviewed, patient examined, no change in status, stable for surgery.  I have reviewed the patient's chart and labs.  Questions were answered to the patient's satisfaction.     Festus Barren

## 2020-04-20 NOTE — OR Nursing (Signed)
Head of bed elevated since no evidence of bleeding,

## 2020-04-20 NOTE — OR Nursing (Signed)
10 minutes after 20 ml air removed from pad and pt head of bead elevated, blood noted under PAD and crease of groin, removed PAD and redressed with gauze/tegaderm dressing, no evidence of active bleeding but returned to flat position.

## 2020-04-20 NOTE — OR Nursing (Signed)
Pt stable, groin dressing and right behind knee dressing clean and intact. Pt readied for discharge

## 2020-04-20 NOTE — Progress Notes (Signed)
Pt c/o a weird feeling to the right foot especially in the toes. Good pedal pulse palpable. Foot is slightly cold. Will continue to monitor.

## 2020-04-20 NOTE — Progress Notes (Signed)
PT continues to report difference in sensation to the right foot and toes. PA notified. New order received to elevate the right foot for 10 minutes to see if symptoms alleviate.

## 2020-04-20 NOTE — Op Note (Signed)
[facility]   OPERATIVE NOTE   PRE-OPERATIVE DIAGNOSIS: extensive right lower extremity DVT  POST-OPERATIVE DIAGNOSIS: same   PROCEDURE: 1.   US guidance for vascular access to right popliteal vein 2.   Catheter placement into right external iliac vein from right popliteal approach 3.   IVC gram and right lower extremity venogram 4.   Catheter directed thrombolysis with 4 mg of TPA to the right popliteal and femoral veins 5.   Mechanical thrombectomy to the right popliteal, superficial femoral, and common femoral veins with the penumbra CAT 12 device     SURGEON: Festus Barren, MD  ASSISTANT(S): none  ANESTHESIA: local with moderate conscious sedation for 93 minutes using 4 mg of Versed and 100 mcg of Fentanyl.  This total was for both procedures  ESTIMATED BLOOD LOSS: 200 cc  FINDING(S): 1.   Extensive right lower extremity DVT extending from the right popliteal vein through the superficial femoral vein and common femoral veins.  No significant thrombus was seen in the iliac veins.  The IVC was patent.  SPECIMEN(S):  none  INDICATIONS:    Patient is a 49 y.o. male who presents with extensive right lower extremity DVT and pulmonary embolus.  The pulmonary embolus was treated with thrombectomy as well and was dictated separately.  Patient has marked leg swelling and pain.  Venous intervention is performed to reduce the symtpoms and avoid long term postphlebitic symptoms.    DESCRIPTION: After obtaining full informed written consent, the patient was brought back to the vascular suite and placed supine upon the table. Moderate conscious sedation was administered during a face to face encounter with the patient throughout the procedure with my supervision of the RN administering medicines and monitoring the patient's vital signs, pulse oximetry, telemetry and mental status throughout from the start of the procedure until the patient was taken to the recovery room.   After obtaining adequate anesthesia, the patient was prepped and draped in the standard fashion.    The patient was then placed into the prone position.  The right popliteal vein was then accessed under direct ultrasound guidance without difficulty with a micropuncture needle and a permanent image was recorded.  I then upsized to an 12Fr sheath over a J wire.  Imaging showed extensive DVT with minimal flow.  A Kumpe catheter and Magic tourque wire were then advanced into the CFV and then the external iliac vein and images were performed.   The thrombus went up to the common femoral vein but the iliac veins did not have significant thrombus or stenosis in the IVC was patent.  I then instilled 4 mg of TPA in the right femoral veins and popliteal vein.  I then perform mechanical thrombectomy.  I used the Penumbra Cat 12 catheter and evacuated about 200 cc of effluent with mechanical thrombectomy throughout the CFV, SFV, and popliteal vein.  This had marked improvement with only a small amount of residual thrombus remaining.  Passes were made with both the catheter itself as well as with the separator.  Following this there was markedly improved flow and no further intervention was required. I then elected to terminate the procedure.  The sheath was removed and a dressing was placed.  She was taken to the recovery room in stable condition having tolerated the procedure well.    COMPLICATIONS: None  CONDITION: Stable  Festus Barren 04/20/2020 4:13 PM

## 2020-04-20 NOTE — Progress Notes (Signed)
PA to the bedside. Pt toes on the right foot are warmer. Pt still has a weird feeling inbetween his toes.

## 2020-04-20 NOTE — Discharge Instructions (Signed)
Catheter-Directed Thrombolysis, Care After This sheet gives you information about how to care for yourself after your procedure. Your health care provider may also give you more specific instructions. If you have problems or questions, contact your health care provider. What can I expect after the procedure? After the procedure, it is common to have mild pain around your incision. Follow these instructions at home: Incision care   Follow instructions from your health care provider about how to take care of your incision. Make sure you: ? Wash your hands with soap and water before and after you change your bandage (dressing). If soap and water are not available, use hand sanitizer. ? Change your dressing as told by your health care provider.  Keep the incision area clean and dry.  Do not take baths, swim, or use a hot tub until your health care provider approves. Ask your health care provider if you may take showers. You may only be allowed to take sponge baths.  Check your incision area every day for signs of infection. Check for: ? Redness, swelling, or more pain. ? Fluid or blood. ? Warmth. ? Pus or a bad smell. Medicines  Take over-the-counter and prescription medicines only as told by your health care provider.  If you are taking blood thinners: ? Talk with your health care provider before you take any medicines that contain aspirin or NSAIDs, such as ibuprofen. These medicines increase your risk for dangerous bleeding. ? Take your medicine exactly as told, at the same time every day. ? Avoid activities that could cause injury or bruising, and follow instructions about how to prevent falls. ? Wear a medical alert bracelet or carry a card that lists what medicines you take. Activity   Do not drive until your health care provider approves.  Return to your normal activities as told by your health care provider. Ask your health care provider what activities are safe for  you. General instructions   Raise (elevate) your legs above the level of your heart when sitting or lying down. You can do this by putting pillows under your legs.  Wear compression stockings as told by your health care provider. These stockings help to prevent blood clots and reduce swelling in your legs.  Drink enough fluid to keep your urine pale yellow.  Do not use any products that contain nicotine or tobacco, such as cigarettes, e-cigarettes, and chewing tobacco. If you need help quitting, ask your health care provider.  Keep all follow-up visits as told by your health care provider. This is important. Contact a health care provider if:  You have redness, swelling, or more pain around your incision.  You have fluid or blood coming from your incision.  Your incision feels warm to the touch.  You have pus or a bad smell coming from your incision.  You have chills or a fever.  You bleed or bruise easily.  You have blood in your urine or stool. Get help right away if:  You have any symptoms of a stroke. "BE FAST" is an easy way to remember the main warning signs of a stroke: ? B - Balance. Signs are dizziness, sudden trouble walking, or loss of balance. ? E - Eyes. Signs are trouble seeing or a sudden change in vision. ? F - Face. Signs are sudden weakness or numbness of the face, or the face or eyelid drooping on one side. ? A - Arms. Signs are weakness or numbness in an arm. This happens suddenly   and usually on one side of the body. ? S - Speech. Signs are sudden trouble speaking, slurred speech, or trouble understanding what people say. ? T - Time. Time to call emergency services. Write down what time symptoms started.  You have other signs of a stroke, such as: ? A sudden, severe headache with no known cause. ? Nausea or vomiting. ? Seizure.  You have bleeding that does not stop after you apply pressure with your hands for several minutes.  You have chest  pain.  You have difficulty breathing.  You cough up blood.  You have redness, warmth, swelling, and pain in an arm or leg. These symptoms may represent a serious problem that is an emergency. Do not wait to see if the symptoms will go away. Get medical help right away. Call your local emergency services (911 in the [place] Do not drive yourself to the hospital. Summary  After the procedure, it is common to have mild pain around your incision.  Follow instructions from your health care provider about how to take care of your incision.  If you are taking blood thinners, talk with your health care provider before you take any medicines that contain aspirin or NSAIDs. Follow instructions about how to prevent falls.  Contact your health care provider if you have signs of an infected incision, such as redness, swelling, or more pain.  Get help right away if you have signs of stroke. BE FAST is an easy way to remember the warning signs of stroke. This information is not intended to replace advice given to you by your health care provider. Make sure you discuss any questions you have with your health care provider. Document Revised: 02/08/2019 Document Reviewed: 02/08/2019 [employer] Patient Education  2020 [employer]  

## 2020-04-20 NOTE — Op Note (Signed)
[facility]  Percutaneous Study/Intervention Procedural Note   Date of Surgery: 04/20/2020,4:06 PM  Surgeon: Festus Barren  Pre-operative Diagnosis: Symptomatic bilateral pulmonary emboli  Post-operative diagnosis:  Same  Procedure(s) Performed:  1.  Contrast injection right heart  2.  Thrombolysis with 8 mg of TPA, 4 mg in the left pulmonary arteries and 4 mg in the right pulmonary arteries  3.  Mechanical thrombectomy left upper lobe and left lower lobe pulmonary arteries with the penumbra CAT 12 device and mechanical thrombectomy to the right middle and right lower lobe pulmonary arteries with the penumbra CAT 12 device  4.  Selective catheter placement right right middle and lower lobe pulmonary arteries  5.  Selective catheter placement left upper and lower lobe pulmonary arteries    Anesthesia: Conscious sedation was administered under my direct supervision by the interventional radiology RN. IV Versed plus fentanyl were utilized. Continuous ECG, pulse oximetry and blood pressure was monitored throughout the entire procedure.  Versed and fentanyl were administered intravenously.  Conscious sedation was administered for a total of 93 minutes using 4 of Versed and 100 mcg of Fentanyl.  This total was for both procedures  EBL: 400 cc  Sheath: 11 French right femoral vein  Contrast: 45 cc   Fluoroscopy Time: 11 minutes  Indications:  Patient presents with pulmonary emboli. The patient is symptomatic with hypoxemia and dyspnea on exertion.  There is evidence of right heart strain on the CT angiogram. The patient is otherwise a good candidate for intervention and even the long-term benefits pulmonary angiography with thrombolysis is offered. The risks and benefits are reviewed long-term benefits are discussed. All questions are answered patient agrees to proceed.  Procedure:  Eric Gibbon Finertyis a 49 y.o. male who was identified and appropriate procedural time out  was performed.  The patient was then placed supine on the table and prepped and draped in the usual sterile fashion.  Ultrasound was used to evaluate the right common femoral vein.  A digital ultrasound image was acquired for the permanent record.  A Seldinger needle was used to access the right common femoral vein under direct ultrasound guidance.  A 0.035 J wire was advanced without resistance and a 5Fr sheath was placed and then upsized to an 11 [place] sheath.    The wire and pigtail catheter were then negotiated into the right atrium and bolus injection of contrast was utilized to demonstrate the right ventricle and the pulmonary artery outflow. The wire and catheter were then negotiated into the main pulmonary artery where hand injection of contrast was utilized to demonstrate the pulmonary arteries and confirm the locations of the pulmonary emboli.  4000 units of heparin was then given and allowed to circulate.  TPA was reconstituted and delivered onto the table. A total of 8 milligrams of TPA was utilized.  4 mg was administered on the left side and 4 mg was administered on the right side. This was then allowed to dwell.  The Penumbra Cat 12 catheter was then advanced up into the pulmonary vasculature. The right lung was addressed first. Catheter was negotiated into the right lower lobe pulmonary artery and selective imaging showed extensive thrombus and mechanical thrombectomy was performed. Follow-up imaging demonstrated a good result and therefore the catheter was renegotiated into the right middle lobe pulmonary artery and again mechanical thrombectomy was performed after selective imaging. Passes were made with both the Penumbra catheter itself as well as introducing the separator. Follow-up imaging was then  performed.  This showed a marked improvement.  There was still some thrombus in the right upper lobe, but given the fact that we still had to treat his leg and the thrombus burden in the  right upper lobe being relatively small amount overall, I elected to not treat this today and turned my attention to the left lung.  The Penumbra Cat 12 catheter was then negotiated to the opposite side. The left lung was then addressed. Catheter was negotiated into the left upper lobe pulmonary artery and selective imaging showed significant thrombus burden and mechanical thrombectomy was performed. Follow-up imaging demonstrated a good result and therefore the catheter was renegotiated into the left lower lobe pulmonary artery and again mechanical thrombectomy was performed after selective imaging. Passes were made with both the Penumbra catheter itself as well as introducing the separator. Follow-up imaging was then performed.  Only a small amount of residual thrombus remained in the left upper and lower lobes.  After review these images wires were reintroduced and the catheters removed. Then, the sheath is then pulled and pressures held. A safeguard is placed.  We then turned our attention to treating the leg DVT which will be dictated separately.    Findings:   Right heart imaging:  Right atrium and right ventricle and the pulmonary outflow tract appears mildly dilated  Right lung: Extensive thrombus throughout the right main pulmonary artery extending into the lobar branches.  Left lung: Extensive thrombus in the left main pulmonary artery extending into both the left upper and left lower lobes.    Disposition: Patient was taken to the recovery room in stable condition having tolerated the procedure well.  Festus Barren 04/20/2020,4:06 PM

## 2020-04-21 ENCOUNTER — Encounter: Payer: Self-pay | Admitting: Vascular Surgery

## 2020-04-24 ENCOUNTER — Telehealth: Payer: Self-pay | Admitting: Primary Care

## 2020-04-24 ENCOUNTER — Other Ambulatory Visit: Payer: Self-pay

## 2020-04-24 ENCOUNTER — Encounter: Payer: Self-pay | Admitting: Oncology

## 2020-04-24 ENCOUNTER — Inpatient Hospital Stay: Payer: 59 | Attending: Oncology | Admitting: Oncology

## 2020-04-24 VITALS — BP 125/84 | HR 59 | Temp 96.2°F | Resp 18 | Wt 215.8 lb

## 2020-04-24 DIAGNOSIS — D6851 Activated protein C resistance: Secondary | ICD-10-CM

## 2020-04-24 DIAGNOSIS — I87001 Postthrombotic syndrome without complications of right lower extremity: Secondary | ICD-10-CM

## 2020-04-24 DIAGNOSIS — I2699 Other pulmonary embolism without acute cor pulmonale: Secondary | ICD-10-CM

## 2020-04-24 DIAGNOSIS — I82431 Acute embolism and thrombosis of right popliteal vein: Secondary | ICD-10-CM

## 2020-04-24 DIAGNOSIS — Z86718 Personal history of other venous thrombosis and embolism: Secondary | ICD-10-CM | POA: Diagnosis present

## 2020-04-24 DIAGNOSIS — Z86711 Personal history of pulmonary embolism: Secondary | ICD-10-CM | POA: Insufficient documentation

## 2020-04-24 DIAGNOSIS — Z7901 Long term (current) use of anticoagulants: Secondary | ICD-10-CM | POA: Diagnosis not present

## 2020-04-24 NOTE — Progress Notes (Signed)
Hematology/Oncology Consult note [facility] Telephone:([phone]) [phone] [phone]   Patient Care Team: Doreene Nestlark, Katherine K, NP as PCP - General (Internal Medicine)  REFERRING PROVIDER: Doreene Nestlark, Katherine K, NP  CHIEF COMPLAINTS/REASON FOR VISIT:  Follow up for acute lower extremity DVT and pulmonary embolism  HISTORY OF PRESENTING ILLNESS:   Eric Bolton is a  49 y.o.  male with PMH listed below was seen in consultation at the request of  Doreene Nestlark, Katherine K, NP  for evaluation of acute lower extremity DVT  12/10/2019 lower extremity venous ultrasound showed acute deep vein thrombosis involving the right popliteal vein, also age indeterminate DVT involving the right peroneal veins, right posterior tibial veins.  No DVT of left lower extremity.  Patient reports that patient noticed right lower extremity calf pain/ankle swelling 8 to 10 months prior to this ultrasound. He was in his usual state of health and discovered acute onset of right lower extremity pain and swelling.  She was treated for gout flare.  She was seen by primary care provider on 05/17/2019.  At that time the swelling persists, and was attributed to gout, lower extremity vein insufficiency.  Was recommended to use compression stocking.  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 Ultrasound in April 2021 is positive for acute DVT and patient was started on Eliquis 10 mg twice daily for a week followed by Eliquis 5 mg twice daily.  Patient reports that lower extremity swelling has improved although he continues to have some edema at the end of the day.  No calf pain.  Patient denies any shortness of breath, chest pain. He tolerates anticoagulation.  No bleeding symptoms. Patient was referred to establish care with hematology for further evaluation  and management.   INTERVAL HISTORY Eric Bolton is a 49 y.o. male who has above history reviewed by me today presents for follow up visit for management of DVT and PE. Problems and complaints are listed below: Patient was last seen by me in June 2021 and was recommended to complete 3 to 6 months of anticoagulation and switch to long-term anticoagulation prophylaxis.  Patient has factor V Leiden heterozygous state. Patient had an appointment with me in August.  He was not able to make it and also run out of Eliquis.  Patient also had a long distance car drive and after that travel he developed right lower extremity pain and swelling.  He also experienced mild shortness of breath with exertion.  Patient went to emergency room on 04/17/2020 after the onset of the symptoms and was found to have extensive occlusive DVT throughout the right lower extremity extending from the common femoral vein into the calf.  CT also showed bilateral central pulmonary emboli with the most central embolus within the left main pulmonary artery.  The thrombus becomes nearly occlusive at the bifurcation of the lingula and the left lower lobe arteries.  Near occlusion pulmonary embolus in the distal right main pulmonary artery extending to the lobar branches of the upper and lower lobes.  Right heart strain.  Patient was started on heparin drip in the hospital.  04/20/2020, patient had catheter directed thrombolysis to the right popliteal and femoral veins, mechanical thrombectomy to the right popliteal, superficial femoral, common femoral veins.  He also had catheter directed thrombolysis and mechanical embolectomy left upper lobe and the left  lobe pulmonary arteries.  He was also switched to Eliquis 10mg  BID x 7 days followed by Eliquis 5mg  BID.  Today she was accompanied by his wife.  He continues to have right lower extremity swelling and ache which has not resolved since discharge.   Review of Systems  Constitutional:  Negative for appetite change, chills, fatigue, fever and unexpected weight change.  HENT:   Negative for hearing loss and voice change.   Eyes: Negative for eye problems and icterus.  Respiratory: Negative for chest tightness, cough and shortness of breath.   Cardiovascular: Negative for chest pain and leg swelling.  Gastrointestinal: Negative for abdominal distention and abdominal pain.  Endocrine: Negative for hot flashes.  Genitourinary: Negative for difficulty urinating, dysuria and frequency.   Musculoskeletal: Negative for arthralgias.       RLE swelling and ache.   Skin: Negative for itching and rash.  Neurological: Negative for light-headedness and numbness.  Hematological: Negative for adenopathy. Does not bruise/bleed easily.  Psychiatric/Behavioral: Negative for confusion.    MEDICAL HISTORY:  Past Medical History:  Diagnosis Date  . DVT (deep venous thrombosis) (HCC) 2020   right LE  . Embolus (HCC) 12/2019  . Pulmonary embolus (HCC) 04/2020    SURGICAL HISTORY: Past Surgical History:  Procedure Laterality Date  . HERNIA REPAIR  2009  . PULMONARY THROMBECTOMY N/A 04/20/2020   Procedure: PULMONARY THROMBECTOMY / THROMBOLYSIS POSSIBLE RIGHT LOWER EXTREMITY THROMBECTOMY / THROMBOLYSIS / POSSIBLE IVC FILTER;  Surgeon: 2010, MD;  Location: [facility] INVASIVE CV LAB;  Service: Cardiovascular;  Laterality: N/A;  . VASECTOMY  1999    SOCIAL HISTORY: Social History   Socioeconomic History  . Marital status: Married    Spouse name: Dene  . Number of children: 3  . Years of education: Not on file  . Highest education level: Not on file  Occupational History  . Not on file  Tobacco Use  . Smoking status: Never Smoker  . Smokeless tobacco: Never Used  Substance and Sexual Activity  . Alcohol use: Yes    Comment: once a week  . Drug use: No  . Sexual activity: Not on file  Other Topics Concern  . Not on file  Social History Narrative   Married.   3 children.      Works as a 06/20/2020   Enjoys sports: bikes every day.    Social Determinants of Health   Financial Resource Strain:   . Difficulty of Paying Living Expenses: Not on file  Food Insecurity:   . Worried About Annice Needy in the Last Year: Not on file  . Ran Out of Food in the Last Year: Not on file  Transportation Needs:   . Lack of Transportation (Medical): Not on file  . Lack of Transportation (Non-Medical): Not on file  Physical Activity:   . Days of Exercise per Week: Not on file  . Minutes of Exercise per Session: Not on file  Stress:   . Feeling of Stress : Not on file  Social Connections:   . Frequency of Communication with Friends and Family: Not on file  . Frequency of Social Gatherings with Friends and Family: Not on file  . Attends Religious Services: Not on file  . Active Member of Clubs or Organizations: Not on file  . Attends Midwife Meetings: Not on file  . Marital Status: Not on file  Intimate Partner Violence:   . Fear of Current or Ex-Partner: Not on file  .  Emotionally Abused: Not on file  . Physically Abused: Not on file  . Sexually Abused: Not on file    FAMILY HISTORY: No family history on file.  ALLERGIES:  has No Known Allergies.  MEDICATIONS:  Current Outpatient Medications  Medication Sig Dispense Refill  . [START ON 05/18/2020] apixaban (ELIQUIS) 5 MG TABS tablet Take 1 tablet (5 mg total) by mouth 2 (two) times daily. 60 tablet 11  . ibuprofen (ADVIL) 200 MG tablet Take 200 mg by mouth every 4 (four) hours as needed.    . APIXABAN (ELIQUIS) VTE STARTER PACK (  AND ) Take as directed on package: start with two-5mg  tablets twice daily for 7 days. On day 8, switch to one-5mg  tablet twice daily. (Patient not taking: Reported on 04/24/2020) 1 each 0   No current facility-administered medications for this visit.     PHYSICAL EXAMINATION: ECOG PERFORMANCE STATUS: 0 - Asymptomatic Vitals:   04/24/20 0856  BP:  125/84  Pulse: (!) 59  Resp: 18  Temp: (!) 96.2 F (35.7 C)   Filed Weights   04/24/20 0856  Weight: 215 lb 12.8 oz (97.9 kg)    Physical Exam Constitutional:      General: He is not in acute distress. HENT:     Head: Normocephalic and atraumatic.  Eyes:     General: No scleral icterus. Cardiovascular:     Rate and Rhythm: Normal rate and regular rhythm.     Heart sounds: Normal heart sounds.  Pulmonary:     Effort: Pulmonary effort is normal. No respiratory distress.     Breath sounds: No wheezing.  Abdominal:     General: Bowel sounds are normal. There is no distension.     Palpations: Abdomen is soft.  Musculoskeletal:        General: No deformity. Normal range of motion.     Cervical back: Normal range of motion and neck supple.     Comments: Right lower extremity with compression stock.   Skin:    General: Skin is warm and dry.     Findings: No erythema or rash.     Comments: 2 skin  blisters on his RLE  Neurological:     Mental Status: He is alert and oriented to person, place, and time. Mental status is at baseline.     Cranial Nerves: No cranial nerve deficit.     Coordination: Coordination normal.  Psychiatric:        Mood and Affect: Mood normal.     LABORATORY DATA:  I have reviewed the data as listed Lab Results  Component Value Date   WBC 10.0 04/18/2020   HGB 14.2 04/18/2020   HCT 40.9 04/18/2020   MCV 89.7 04/18/2020   PLT 133 (L) 04/18/2020   Recent Labs    05/17/19 1008 05/17/19 1008 01/25/20 1214 04/17/20 1034 04/18/20 0407  NA 137   < > 138 137 138  K 4.5   < > 4.3 3.9 4.3  CL 104   < > 105 103 104  CO2 27   < > GLUCOSE 90   < > 85 109* 106*  BUN 22   < > 23* 16 15  CREATININE 1.28   < > 1.35* 1.28* 1.20  CALCIUM 9.8   < > 9.2 9.0 9.3  GFRNONAA  --   --  >60 >60 >60  GFRAA  --   --  >60 >60 >60  PROT 7.3  --  7.4 7.4  --  ALBUMIN 4.7  --  4.5 4.4  --   AST 28  --  31 37  --   ALT 38  --  32 40  --   ALKPHOS 55   --  44 56  --   BILITOT 0.7  --  0.8 1.1  --    < > = values in this interval not displayed.   Iron/TIBC/Ferritin/ %Sat No results found for: IRON, TIBC, FERRITIN, IRONPCTSAT    RADIOGRAPHIC STUDIES: I have personally reviewed the radiological images as listed and agreed with the findings in the report. CT Angio Chest PE W and/or Wo Contrast  Result Date: 04/17/2020 CLINICAL DATA:  Right lower extremity DVT. Clinical suspicion for pulmonary embolus. EXAM: CT ANGIOGRAPHY CHEST WITH CONTRAST TECHNIQUE: Multidetector CT imaging of the chest was performed using the standard protocol during bolus administration of intravenous contrast. Multiplanar CT image reconstructions and MIPs were obtained to evaluate the vascular anatomy. CONTRAST:  OMNIPAQUE IOHEXOL 350 MG/ML SOLN COMPARISON:  None. FINDINGS: Cardiovascular: Satisfactory opacification of the pulmonary arteries. Bilateral central pulmonary emboli are present with the most central embolus within the left main pulmonary artery. The thrombus becomes nearly occlusive at the bifurcation of the lingula and left lower lobe lobar arteries. Near occlusive pulmonary embolus is also present in the distal right main pulmonary artery, extending to the lobar branches of the upper and lower lobe. There is right heart strain with RV / LV ratio of 1.4. Mediastinum/Nodes: No enlarged mediastinal, hilar, or axillary lymph nodes. Thyroid gland, trachea, and esophagus demonstrate no significant findings. Lungs/Pleura: Lungs are clear. No pleural effusion or pneumothorax. Upper Abdomen: No acute abnormality. Musculoskeletal: No chest wall abnormality. No acute or significant osseous findings. Review of the MIP images confirms the above findings. IMPRESSION: 1. Bilateral central pulmonary emboli with the most central embolus within the left main pulmonary artery. The thrombus becomes nearly occlusive at the bifurcation of the lingula and left lower lobe lobar arteries.  2. Near occlusive pulmonary embolus in the distal right main pulmonary artery, extending to the lobar branches of the upper and lower lobes. 3. Right heart strain with RV / LV ratio of 1.4. 4. Normal appearance of the lungs. Critical Value/emergent results were called by telephone at the time of interpretation on 04/17/2020 at 12:15 pm to Dr. Minna Antis , who verbally acknowledged these results. Electronically Signed   By: Ted Mcalpine M.D.   On: 04/17/2020 12:17   PERIPHERAL VASCULAR CATHETERIZATION  Result Date: 04/20/2020 See op note  US Venous Img Lower Unilateral Right  Result Date: 04/17/2020 CLINICAL DATA:  DVT with increased pain and swelling. History of DVT on Eliquis, but ran out of medication recently. Recent travel. EXAM: RIGHT LOWER EXTREMITY VENOUS DOPPLER ULTRASOUND TECHNIQUE: Gray-scale sonography with compression, as well as color and duplex ultrasound, were performed to evaluate the deep venous system(s) from the level of the common femoral vein through the popliteal and proximal calf veins. COMPARISON:  None. FINDINGS: Nonocclusive clot at the common femoral vein on the right, mobile in the lumen on the cine clip. Occlusive clot with venous expansion at the right femoral vein, popliteal vein, and also involving the deep calf veins. Phasic flow is seen at the left common femoral vein which is patent. These results were called by telephone at the time of interpretation on 04/17/2020 at 10:09 am to provider [facility] , who verbally acknowledged these results. IMPRESSION: Extensive occlusive DVT throughout the right lower extremity extending from the common  femoral vein into the calf. Electronically Signed   By: Marnee Spring M.D.   On: 04/17/2020 10:11      ASSESSMENT & PLAN:  1. Acute deep vein thrombosis (DVT) of popliteal vein of right lower extremity (HCC)   2. Other acute pulmonary embolism, unspecified whether acute cor pulmonale present (HCC)   3.  Heterozygous factor V Leiden mutation (HCC)   4. Post-thrombotic syndrome of right lower extremity    Recurrent acute RLE DVT and acute PE Heterozygous Factor V Leiden mutation I recommend long term anticoagulation.  Finish 7 days of Eliquis 10mg  BID, followed by Eliquis 5mg  BID for 6 months, after that I recommend Eliquis 2.5 BID for long term anticoagulation prophylaxis.  For pulmonary embolism and right heart strain, I recommend patient to avoid strenuous activity for 4 weeks.  He works at , will give him letter for work and recommend light duty. .  Continue follow-up with vascular surgeon. Chronic lower extremity swelling, patient likely has post thrombotic syndrome.  Continue anticoagulation and compression stocking.  Recommend leg elevation.   No orders of the defined types were placed in this encounter.   All questions were answered. The patient knows to call the clinic with any problems questions or concerns.  cc , NP    Return of visit: 3 months Thank you for this kind referral and the opportunity to participate in the care of this patient. A copy of today's note is routed to referring provider    [employer], MD, PhD Hematology Oncology [facility] Pager- [facility] 04/24/2020

## 2020-04-24 NOTE — Telephone Encounter (Signed)
OK with transfer of care as I already see pts wife.   Defer to Mayra Reel on whether earlier appointment is needed and if transfer OK.

## 2020-04-24 NOTE — Progress Notes (Signed)
Thrombolysis of R femoral vein on 9/9 by Dr Wyn Quaker. Has compression hose on R calf. Pain is behind knee and down. He rates the pain 8/10 today. Foot and calf are swollen according to patient.

## 2020-04-24 NOTE — Telephone Encounter (Signed)
Spouse called to schedule appointment She scheduled a new patient 11/9.  She stated pt had appointment an appointment with dr Cathie Hoops  today and follow up with  [facility] in 3 weeks.  She stated she thought pt would be ok to wait till 11/9 for an appointment with you.  Spouse is aware of dr cody's comments below     /04/2020 8:45 PM by Trisha Mangle  I am taking new patients, but I would recommend that he plan to have his hospital follow-up with Jae Dire first. Last I checked my new patient visits were over one month out so it would be more important for him to see Mayra Reel for hospital follow up and then see me later.   Gweneth Dimitri

## 2020-04-24 NOTE — Telephone Encounter (Signed)
Okay with transfer, very nice guy! I am happy to see him for a hospital follow up, he does need evaluation sooner than November.

## 2020-04-25 NOTE — Telephone Encounter (Signed)
Called patient to notify of message. Patient states he does not want an appointment and will see Dr.Cody in November.

## 2020-05-05 ENCOUNTER — Telehealth: Payer: Self-pay | Admitting: *Deleted

## 2020-05-05 NOTE — Telephone Encounter (Signed)
Patient called requesting an change in his Eliquis due to the fact that his employer told him that he cannot work there while on this medicine. He said they told him he can take Plavix or ASA and be ok to work. Please return his call to discuss this matter [phone]

## 2020-05-05 NOTE — Telephone Encounter (Signed)
Done.. MyChart Visit has been sched for 05/08/20 per pt request

## 2020-05-05 NOTE — Telephone Encounter (Signed)
Please schedule him an tele visit to discuss pros and cons of switching to Aspirin. Thanks.

## 2020-05-08 ENCOUNTER — Encounter: Payer: Self-pay | Admitting: Oncology

## 2020-05-08 ENCOUNTER — Inpatient Hospital Stay (HOSPITAL_BASED_OUTPATIENT_CLINIC_OR_DEPARTMENT_OTHER): Payer: 59 | Admitting: Oncology

## 2020-05-08 DIAGNOSIS — I2699 Other pulmonary embolism without acute cor pulmonale: Secondary | ICD-10-CM

## 2020-05-08 DIAGNOSIS — I82411 Acute embolism and thrombosis of right femoral vein: Secondary | ICD-10-CM | POA: Diagnosis not present

## 2020-05-08 NOTE — Progress Notes (Signed)
HEMATOLOGY-ONCOLOGY TeleHEALTH VISIT PROGRESS NOTE  I connected with Eric Bolton on 05/08/20 at  2:45 PM EDT by video enabled telemedicine visit and verified that I am speaking with the correct person using two identifiers. I discussed the limitations, risks, security and privacy concerns of performing an evaluation and management service by telemedicine and the availability of in-person appointments. I also discussed with the patient that there may be a patient responsible charge related to this service. The patient expressed understanding and agreed to proceed.   Other persons participating in the visit and their role in the encounter:  None  Patient's location: Home  Provider's location: office Chief Complaint: follow up for chronic anticoagulation for recurrent thrombosis.    INTERVAL HISTORY Eric Bolton is a 49 y.o. male who has above history reviewed by me today presents for follow up visit for management of chronic anticoagulation for recurrent thrombosis. Problems and complaints are listed below:  Patient has questions about if chronic anticoagulation can be switched to aspirin and Plavix  As he cannot return to work normally due to being on anticoagulation.  He works in Programmer, multimedia. He continues to have right lower extremity tenderness and swelling. Patient is on chronic regulation with Eliquis.  Denies any bleeding events. Review of Systems  Constitutional: Negative for appetite change, chills, fatigue, fever and unexpected weight change.  HENT:   Negative for hearing loss and voice change.   Eyes: Negative for eye problems and icterus.  Respiratory: Negative for chest tightness, cough and shortness of breath.   Cardiovascular: Negative for chest pain and leg swelling.  Gastrointestinal: Negative for abdominal distention and abdominal pain.  Endocrine: Negative for hot flashes.  Genitourinary: Negative for difficulty urinating, dysuria and frequency.    Musculoskeletal: Negative for arthralgias.       Right lower extremity swelling  Skin: Negative for itching and rash.  Neurological: Negative for light-headedness and numbness.  Hematological: Negative for adenopathy. Does not bruise/bleed easily.  Psychiatric/Behavioral: Negative for confusion.    Past Medical History:  Diagnosis Date  . DVT (deep venous thrombosis) (HCC) 2020   right LE  . Embolus (HCC) 12/2019  . Pulmonary embolus (HCC) 04/2020   Past Surgical History:  Procedure Laterality Date  . HERNIA REPAIR  2009  . PULMONARY THROMBECTOMY N/A 04/20/2020   Procedure: PULMONARY THROMBECTOMY / THROMBOLYSIS POSSIBLE RIGHT LOWER EXTREMITY THROMBECTOMY / THROMBOLYSIS / POSSIBLE IVC FILTER;  Surgeon: Annice Needy, MD;  Location: [facility] INVASIVE CV LAB;  Service: Cardiovascular;  Laterality: N/A;  . VASECTOMY  1999    History reviewed. No pertinent family history.  Social History   Socioeconomic History  . Marital status: Married    Spouse name: Dene  . Number of children: 3  . Years of education: Not on file  . Highest education level: Not on file  Occupational History  . Not on file  Tobacco Use  . Smoking status: Never Smoker  . Smokeless tobacco: Never Used  Substance and Sexual Activity  . Alcohol use: Yes    Comment: once a week  . Drug use: No  . Sexual activity: Not on file  Other Topics Concern  . Not on file  Social History Narrative   Married.   3 children.    Works as a Midwife   Enjoys sports: bikes every day.    Social Determinants of Health   Financial Resource Strain:   . Difficulty of Paying Living Expenses: Not on file  Food Insecurity:   .  Worried About Programme researcher, broadcasting/film/video in the Last Year: Not on file  . Ran Out of Food in the Last Year: Not on file  Transportation Needs:   . Lack of Transportation (Medical): Not on file  . Lack of Transportation (Non-Medical): Not on file  Physical Activity:   . Days of Exercise per Week: Not on  file  . Minutes of Exercise per Session: Not on file  Stress:   . Feeling of Stress : Not on file  Social Connections:   . Frequency of Communication with Friends and Family: Not on file  . Frequency of Social Gatherings with Friends and Family: Not on file  . Attends Religious Services: Not on file  . Active Member of Clubs or Organizations: Not on file  . Attends Banker Meetings: Not on file  . Marital Status: Not on file  Intimate Partner Violence:   . Fear of Current or Ex-Partner: Not on file  . Emotionally Abused: Not on file  . Physically Abused: Not on file  . Sexually Abused: Not on file    Current Outpatient Medications on File Prior to Visit  Medication Sig Dispense Refill  . [START ON 05/18/2020] apixaban (ELIQUIS) 5 MG TABS tablet Take 1 tablet (5 mg total) by mouth 2 (two) times daily. 60 tablet 11  . ibuprofen (ADVIL) 200 MG tablet Take 200 mg by mouth every 4 (four) hours as needed.    . APIXABAN (ELIQUIS) VTE STARTER PACK (10MG  AND 5MG ) Take as directed on package: start with two-5mg  tablets twice daily for 7 days. On day 8, switch to one-5mg  tablet twice daily. (Patient not taking: Reported on 04/24/2020) 1 each 0   No current facility-administered medications on file prior to visit.    No Known Allergies     Observations/Objective: Today's Vitals   05/08/20 1444  PainSc: 0-No pain   There is no height or weight on file to calculate BMI.  Physical Exam Neurological:     Mental Status: He is alert.     CBC    Component Value Date/Time   WBC 10.0 04/18/2020 0407   RBC 4.56 04/18/2020 0407   HGB 14.2 04/18/2020 0407   HCT 40.9 04/18/2020 0407   PLT 133 (L) 04/18/2020 0407   MCV 89.7 04/18/2020 0407   MCH 31.1 04/18/2020 0407   MCHC 34.7 04/18/2020 0407   RDW 12.2 04/18/2020 0407   LYMPHSABS 3.6 01/25/2020 1214   MONOABS 0.8 01/25/2020 1214   EOSABS 0.1 01/25/2020 1214   BASOSABS 0.0 01/25/2020 1214    CMP     Component Value  Date/Time   NA 138 04/18/2020 0407   K 4.3 04/18/2020 0407   CL 104 04/18/2020 0407   CO2 26 04/18/2020 0407   GLUCOSE 106 (H) 04/18/2020 0407   BUN 15 04/18/2020 0407   CREATININE 1.20 04/18/2020 0407   CALCIUM 9.3 04/18/2020 0407   PROT 7.4 04/17/2020 1034   ALBUMIN 4.4 04/17/2020 1034   AST 37 04/17/2020 1034   ALT 40 04/17/2020 1034   ALKPHOS 56 04/17/2020 1034   BILITOT 1.1 04/17/2020 1034   GFRNONAA >60 04/18/2020 0407   GFRAA >60 04/18/2020 0407     Assessment and Plan: 1. Acute deep vein thrombosis (DVT) of femoral vein of right lower extremity (HCC)   2. Other acute pulmonary embolism, unspecified whether acute cor pulmonale present (HCC)     Recurrent right lower extremity and pulmonary embolism.  Discussed with him about my recommendation  about minimum 6 months of therapeutic anticoagulation with Eliquis 5mg  BID, after that he will be on Eliquis 2.5mg  for prophylaxis.  Discussed with him about the difference of ASA and Plavix which work better in preventing arterial thrombosis than venous thrombosis. He appreciates the explanation and agrees with continue Elqiuis  Post-thrombosis syndrome.  Recommend compressive stocking and follow up with vascular surgeon. .    Follow Up Instructions: Keep his already scheduled appointment.    I discussed the assessment and treatment plan with the patient. The patient was provided an opportunity to ask questions and all were answered. The patient agreed with the plan and demonstrated an understanding of the instructions.  The patient was advised to call back or seek an in-person evaluation if the symptoms worsen or if the condition fails to improve as anticipated.    , MD 05/08/2020 8:18 PM

## 2020-05-08 NOTE — Progress Notes (Signed)
Patient verified using two identifiers for virtual visit via telephone today.  Discuss medication change from Eliquis.  The swelling of right leg/ankle has not improved.

## 2020-05-11 ENCOUNTER — Telehealth: Payer: Self-pay | Admitting: *Deleted

## 2020-05-11 ENCOUNTER — Other Ambulatory Visit (INDEPENDENT_AMBULATORY_CARE_PROVIDER_SITE_OTHER): Payer: Self-pay | Admitting: Vascular Surgery

## 2020-05-11 DIAGNOSIS — I82411 Acute embolism and thrombosis of right femoral vein: Secondary | ICD-10-CM

## 2020-05-11 NOTE — Telephone Encounter (Signed)
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 Please return his call [phone].

## 2020-05-11 NOTE — Telephone Encounter (Signed)
Please ask what specific questions he has. I have had a telemedicine visit with him recently and answered his questions. There is no new tests done after that. I recommend him to continue eliquis 5mg  BID for a total of 6 months, and then continue Eliquis 2.5mg  BID as maintenance.  I have discussed with him that Aspirin and Plavix are not effective in treating blood clots. I strongly recommend him to stay on Eliquis 5mg  BID for at least 6 months. I will defer to his occupational health provider to determine if he is able to return to full duty-

## 2020-05-12 NOTE — Telephone Encounter (Signed)
Patient notified and states that he is aware that he may be on blood thinners for "lifetime." He is going to discuss this with the airport doctor and he wanted to let us know that they might send paperwork to our office for Dr. Cathie Hoops to fill out. This in regards to his job description and if he will be ok  returning to duty with  or without restrictions.

## 2020-05-16 ENCOUNTER — Encounter (INDEPENDENT_AMBULATORY_CARE_PROVIDER_SITE_OTHER): Payer: Self-pay | Admitting: Nurse Practitioner

## 2020-05-16 ENCOUNTER — Ambulatory Visit (INDEPENDENT_AMBULATORY_CARE_PROVIDER_SITE_OTHER): Payer: 59

## 2020-05-16 ENCOUNTER — Other Ambulatory Visit: Payer: Self-pay

## 2020-05-16 ENCOUNTER — Ambulatory Visit (INDEPENDENT_AMBULATORY_CARE_PROVIDER_SITE_OTHER): Payer: 59 | Admitting: Nurse Practitioner

## 2020-05-16 VITALS — BP 121/79 | HR 51 | Resp 16 | Wt 215.0 lb

## 2020-05-16 DIAGNOSIS — E785 Hyperlipidemia, unspecified: Secondary | ICD-10-CM | POA: Diagnosis not present

## 2020-05-16 DIAGNOSIS — I2699 Other pulmonary embolism without acute cor pulmonale: Secondary | ICD-10-CM | POA: Diagnosis not present

## 2020-05-16 DIAGNOSIS — I82411 Acute embolism and thrombosis of right femoral vein: Secondary | ICD-10-CM

## 2020-05-16 DIAGNOSIS — R600 Localized edema: Secondary | ICD-10-CM

## 2020-05-16 NOTE — Progress Notes (Addendum)
Subjective:    Patient ID: Eric Bolton, male    DOB: [birth year], 49 y.o.   MRN: [id number] Chief Complaint  Patient presents with  . Follow-up    [facility] 4wk ultrasound follow up    Eric Bolton is a 49 year old male that presents today following after intervention for bilateral pulmonary embolisms as well as right lower extremity DVT.  This intervention to place on 04/20/2020, including:  PROCEDURE: 1. US guidance for vascular access to right popliteal vein 2. Catheter placement into right external iliac vein from right popliteal approach 3. IVC gram and right lower extremity venogram 4.   Catheter directed thrombolysis with 4 mg of TPA to the right popliteal and femoral veins 5. Mechanical thrombectomy to the right popliteal, superficial femoral, and common femoral veins with the penumbra CAT 12 device    Procedure(s) Performed:             1.  Contrast injection right heart             2.  Thrombolysis with 8 mg of TPA, 4 mg in the left pulmonary arteries and 4 mg in the right pulmonary arteries             3.  Mechanical thrombectomy left upper lobe and left lower lobe pulmonary arteries with the penumbra CAT 12 device and mechanical thrombectomy to the right middle and right lower lobe pulmonary arteries with the penumbra CAT 12 device             4.  Selective catheter placement right right middle and lower lobe pulmonary arteries            5.  Selective catheter placement left upper and lower lobe pulmonary arteries   Initially the patient had a DVT diagnosed in about April 2021.  The patient was placed on Eliquis at that time and was doing better until he ran out of his Eliquis.  He was out for approximately a month and a half and noticed that he had worsening pain and swelling of his right lower extremity after traveling to [place].  He notes that the drive is approximately 8 hours.  He subsequently presented to the emergency room with worsening leg pain  and swelling in addition to shortness of breath.  The patient is also noted to have factor V heterozygous state.   Today noninvasive studies show no evidence of acute thrombus.  However there are findings consistent with a chronic deep vein thrombosis involving the right common femoral vein, right femoral vein, right popliteal vein and right posterior tibial veins.  These findings are consistent with previous exam.   Review of Systems  Cardiovascular: Positive for leg swelling.  Hematological: Bruises/bleeds easily.  All other systems reviewed and are negative.      Objective:   Physical Exam Vitals reviewed.  HENT:     Head: Normocephalic.  Cardiovascular:     Rate and Rhythm: Normal rate and regular rhythm.     Pulses: Normal pulses.     Heart sounds: Normal heart sounds.  Pulmonary:     Effort: Pulmonary effort is normal.     Breath sounds: Normal breath sounds.  Musculoskeletal:        General: Normal range of motion.     Right lower leg: 2+ Edema present.  Skin:    Capillary Refill: Capillary refill takes less than 2 seconds.  Neurological:     Mental Status: He is alert and oriented to  person, place, and time.  Psychiatric:        Mood and Affect: Mood normal.        Behavior: Behavior normal.        Thought Content: Thought content normal.        Judgment: Judgment normal.     BP 121/79 (BP Location: Right Arm)   Pulse (!) 51   Resp 16   Wt 215 lb (97.5 kg)   BMI 27.60 kg/m   Past Medical History:  Diagnosis Date  . DVT (deep venous thrombosis) (HCC) 2020   right LE  . Embolus (HCC) 12/2019  . Pulmonary embolus (HCC) 04/2020    Social History   Socioeconomic History  . Marital status: Married    Spouse name: Dene  . Number of children: 3  . Years of education: Not on file  . Highest education level: Not on file  Occupational History  . Not on file  Tobacco Use  . Smoking status: Never Smoker  . Smokeless tobacco: Never Used  Substance and  Sexual Activity  . Alcohol use: Yes    Comment: once a week  . Drug use: No  . Sexual activity: Not on file  Other Topics Concern  . Not on file  Social History Narrative   Married.   3 children.    Works as a Midwife   Enjoys sports: bikes every day.    Social Determinants of Health   Financial Resource Strain:   . Difficulty of Paying Living Expenses: Not on file  Food Insecurity:   . Worried About Programme researcher, broadcasting/film/video in the Last Year: Not on file  . Ran Out of Food in the Last Year: Not on file  Transportation Needs:   . Lack of Transportation (Medical): Not on file  . Lack of Transportation (Non-Medical): Not on file  Physical Activity:   . Days of Exercise per Week: Not on file  . Minutes of Exercise per Session: Not on file  Stress:   . Feeling of Stress : Not on file  Social Connections:   . Frequency of Communication with Friends and Family: Not on file  . Frequency of Social Gatherings with Friends and Family: Not on file  . Attends Religious Services: Not on file  . Active Member of Clubs or Organizations: Not on file  . Attends Banker Meetings: Not on file  . Marital Status: Not on file  Intimate Partner Violence:   . Fear of Current or Ex-Partner: Not on file  . Emotionally Abused: Not on file  . Physically Abused: Not on file  . Sexually Abused: Not on file    Past Surgical History:  Procedure Laterality Date  . HERNIA REPAIR  2009  . PULMONARY THROMBECTOMY N/A 04/20/2020   Procedure: PULMONARY THROMBECTOMY / THROMBOLYSIS POSSIBLE RIGHT LOWER EXTREMITY THROMBECTOMY / THROMBOLYSIS / POSSIBLE IVC FILTER;  Surgeon: Annice Needy, MD;  Location: [facility] INVASIVE CV LAB;  Service: Cardiovascular;  Laterality: N/A;  . VASECTOMY  1999    History reviewed. No pertinent family history.  No Known Allergies     Assessment & Plan:   1. Deep vein thrombosis (DVT) of femoral vein of right lower extremity, unspecified chronicity (HCC) Noninvasive  studies show that patient DVT has progressed to a chronic stage.  Discussed possibility of changes as outlined below.  Also discussed anticoagulation with the patient.  In agreement with patient's hematologist, the patient should remain on anticoagulation for 6 months with  transition to a prophylactic dose of 2.5 mg twice a day after 6 months.  This is due to his DVT and pulmonary embolism, in addition to his factor V Leiden heterozygous disorder.  Ultimately he should remain on lifelong anticoagulation.  Currently, the patient has recovered enough to return to work on a full duty basis.  We will plan to have the patient back in 6 months and discuss transition of anticoagulation as well as reassess lower extremity edema with possible discussion of lymphedema pump if it has not resolved.  2. Hyperlipidemia, unspecified hyperlipidemia type Continue statin as ordered and reviewed, no changes at this time   3. Lower extremity edema Lower extremity edema related to DVT.  Discussed with patient the normal postoperative course of swelling and postphlebitic pain.  Encourage utilization of medical grade 1 compression on a daily basis especially given his occupation.  Discussed the difference between postphlebitic swelling versus possible recurrent DVT.  4. Acute pulmonary embolism without acute cor pulmonale, unspecified pulmonary embolism type (HCC) Currently back to baseline.  Discussed imaging for follow-up of pulmonary embolism.  Currently because patient has no significant worsening symptoms, repeat imaging is not indicated and patient agrees with plan.  Patient will continue with anticoagulation as discussed above.   Current Outpatient Medications on File Prior to Visit  Medication Sig Dispense Refill  . [START ON 05/18/2020] apixaban (ELIQUIS) 5 MG TABS tablet Take 1 tablet (5 mg total) by mouth 2 (two) times daily. 60 tablet 11  . ibuprofen (ADVIL) 200 MG tablet Take 200 mg by mouth every 4 (four)  hours as needed.    . APIXABAN (ELIQUIS) VTE STARTER PACK (10MG  AND 5MG ) Take as directed on package: start with two-5mg  tablets twice daily for 7 days. On day 8, switch to one-5mg  tablet twice daily. (Patient not taking: Reported on 04/24/2020) 1 each 0   No current facility-administered medications on file prior to visit.    There are no Patient Instructions on file for this visit. No follow-ups on file.   , NP

## 2020-05-18 ENCOUNTER — Encounter (INDEPENDENT_AMBULATORY_CARE_PROVIDER_SITE_OTHER): Payer: 59

## 2020-05-18 ENCOUNTER — Ambulatory Visit (INDEPENDENT_AMBULATORY_CARE_PROVIDER_SITE_OTHER): Payer: 59 | Admitting: Nurse Practitioner

## 2020-06-20 ENCOUNTER — Encounter: Payer: Self-pay | Admitting: Family Medicine

## 2020-06-20 ENCOUNTER — Other Ambulatory Visit: Payer: Self-pay

## 2020-06-20 ENCOUNTER — Ambulatory Visit (INDEPENDENT_AMBULATORY_CARE_PROVIDER_SITE_OTHER): Payer: 59 | Admitting: Family Medicine

## 2020-06-20 VITALS — BP 90/60 | HR 79 | Temp 96.9°F | Ht 72.75 in | Wt 212.2 lb

## 2020-06-20 DIAGNOSIS — I82431 Acute embolism and thrombosis of right popliteal vein: Secondary | ICD-10-CM | POA: Diagnosis not present

## 2020-06-20 DIAGNOSIS — E785 Hyperlipidemia, unspecified: Secondary | ICD-10-CM | POA: Diagnosis not present

## 2020-06-20 DIAGNOSIS — D6851 Activated protein C resistance: Secondary | ICD-10-CM | POA: Diagnosis not present

## 2020-06-20 NOTE — Progress Notes (Signed)
Subjective:     Eric Bolton is a 49 y.o. male presenting for transfer of care     HPI  #Blood clot - continues to have swelling - it is fine in the morning - no pain - still exercising - still with swelling - has been wearing compression socks - wearing only on the one leg - is adjusting to having to be on the blood thinner life long - occasionally forgetting evening dose 1 time per month  #Insomnia - trouble calming down - mind is always racing - only sleeping 2-3 hours - has not tried OTC sleep - denies anxiety symptoms - caffeine - 400-500 mg of caffeine    Review of Systems   Social History   Tobacco Use  Smoking Status Never Smoker  Smokeless Tobacco Never Used        Objective:    BP Readings from Last 3 Encounters:  06/20/20 90/60  05/16/20 121/79  04/24/20 125/84   Wt Readings from Last 3 Encounters:  06/20/20 212 lb 4 oz (96.3 kg)  05/16/20 215 lb (97.5 kg)  04/24/20 215 lb 12.8 oz (97.9 kg)    BP 90/60   Pulse 79   Temp (!) 96.9 F (36.1 C) (Temporal)   Ht 6' 0.75" (1.848 m)   Wt 212 lb 4 oz (96.3 kg)   SpO2 96%   BMI 28.20 kg/m    Physical Exam Constitutional:      Appearance: Normal appearance. He is not ill-appearing or diaphoretic.  HENT:     Right Ear: External ear normal.     Left Ear: External ear normal.     Nose: Nose normal.  Eyes:     General: No scleral icterus.    Extraocular Movements: Extraocular movements intact.     Conjunctiva/sclera: Conjunctivae normal.  Cardiovascular:     Rate and Rhythm: Normal rate and regular rhythm.  Pulmonary:     Effort: Pulmonary effort is normal.     Breath sounds: Normal breath sounds.  Musculoskeletal:     Cervical back: Neck supple.     Comments: Slight swelling of the RLE  Skin:    General: Skin is warm and dry.  Neurological:     Mental Status: He is alert. Mental status is at baseline.  Psychiatric:        Mood and Affect: Mood normal.      Lab  Results  Component Value Date   CHOL 200 11/18/2016   HDL 39.00 (L) 11/18/2016   LDLCALC 126 (H) 11/21/2015   LDLDIRECT 131.0 11/18/2016   TRIG 212.0 (H) 11/18/2016   CHOLHDL 5 11/18/2016        Assessment & Plan:   Problem List Items Addressed This Visit      Cardiovascular and Mediastinum   Acute deep vein thrombosis (DVT) of popliteal vein of right lower extremity (HCC) - Primary    Reviewed most recent vascular note - is diagnosed with chronic DVT and will need lifelong anticoagulation. Anticipating 6 months of treatment and then decrease dose. Discussed that after he completes acute treatment phase would be Ok with long term management and prescribing. Cont Eliquis 5 mg BID.         Hematopoietic and Hemostatic   Factor V Leiden (HCC)    Needs life long treatment after chronic blood clots        Other   Hyperlipidemia    Reports recent blood work through his job. He will bring results. If unable  to bring by next visit anticipate ordering labs here.          Return in about 1 year (around 06/20/2021).  Lynnda Child, MD  This visit occurred during the SARS-CoV-2 public health emergency.  Safety protocols were in place, including screening questions prior to the visit, additional usage of staff PPE, and extensive cleaning of exam room while observing appropriate contact time as indicated for disinfecting solutions.

## 2020-06-20 NOTE — Assessment & Plan Note (Signed)
Reviewed most recent vascular note - is diagnosed with chronic DVT and will need lifelong anticoagulation. Anticipating 6 months of treatment and then decrease dose. Discussed that after he completes acute treatment phase would be Ok with long term management and prescribing. Cont Eliquis 5 mg BID.

## 2020-06-20 NOTE — Assessment & Plan Note (Signed)
Needs life long treatment after chronic blood clots

## 2020-06-20 NOTE — Patient Instructions (Addendum)
3-5 mg Melatonin - take 1-2 hours before bed  Try to reduce caffeine and drink it earlier in the day  Drop off blood work to put in chart  Sleep hygiene checklist: 1. Avoid naps during the day 2. Avoid stimulants such as caffeine and nicotine. Avoid bedtime alcohol (it can speed onset of sleep but the body's metabolism can cause awakenings). At least 2 hours before bedtime 3. All forms of exercise help ensure sound sleep - limit vigorous exercise to morning or late afternoon 4. Avoid food too close to bedtime including chocolate (which contains caffeine) 5. Soak up natural light 6. Establish regular bedtime routine. 7. Associate bed with sleep - avoid TV, computer or phone, reading while in bed. 8. Ensure pleasant, relaxing sleep environment - quiet, dark, cool room.  Good Sleep Hygiene Habits -- Got to bed and wake up within an hour of the same time every day -- Avoid bright screens (from laptop, phone, TV) within at least 30 minutes before bed. The "blue light" supresses the sleep hormone melatonin and the content may stimulate as well -- Maintain a quiet and dark sleep environment (blackout curtains, turn on a fan or white noise to block out disruptive sounds) -- Practicing relaxing activites before bed (taking a shower, reading a book, journaling, meditation app) -- To quiet a busy mind -- consider journaling before bed (jotting down reminders, worry thoughts, as well as positive things like a gratitude list)   Begin a Mindfulness/Meditation practice -- this can take a little as 3 minutes -- You can find resources in books -- Or you can download apps like  ---- Headspace App (which currently has free content called "Weathering the Storm") ---- Calm (which has a few free options)  ---- Insignt Timer ---- Stop, Breathe & Think  # With each of these Apps - you should decline the "start free trial" offer and as you search through the App should be able to access some of their free  content. You can also chose to pay for the content if you find one that works well for you.   # Many of them also offer sleep specific content which may help with insomnia

## 2020-06-20 NOTE — Assessment & Plan Note (Signed)
Reports recent blood work through his job. He will bring results. If unable to bring by next visit anticipate ordering labs here.

## 2020-07-17 ENCOUNTER — Inpatient Hospital Stay (HOSPITAL_BASED_OUTPATIENT_CLINIC_OR_DEPARTMENT_OTHER): Payer: 59 | Admitting: Oncology

## 2020-07-17 ENCOUNTER — Other Ambulatory Visit: Payer: Self-pay

## 2020-07-17 ENCOUNTER — Encounter: Payer: Self-pay | Admitting: Oncology

## 2020-07-17 ENCOUNTER — Inpatient Hospital Stay: Payer: 59 | Attending: Oncology

## 2020-07-17 VITALS — BP 132/80 | HR 64 | Temp 97.9°F

## 2020-07-17 DIAGNOSIS — Z86718 Personal history of other venous thrombosis and embolism: Secondary | ICD-10-CM | POA: Insufficient documentation

## 2020-07-17 DIAGNOSIS — Z86711 Personal history of pulmonary embolism: Secondary | ICD-10-CM | POA: Diagnosis not present

## 2020-07-17 DIAGNOSIS — I2699 Other pulmonary embolism without acute cor pulmonale: Secondary | ICD-10-CM

## 2020-07-17 DIAGNOSIS — I87001 Postthrombotic syndrome without complications of right lower extremity: Secondary | ICD-10-CM | POA: Diagnosis present

## 2020-07-17 DIAGNOSIS — I82411 Acute embolism and thrombosis of right femoral vein: Secondary | ICD-10-CM | POA: Insufficient documentation

## 2020-07-17 DIAGNOSIS — D6851 Activated protein C resistance: Secondary | ICD-10-CM | POA: Diagnosis not present

## 2020-07-17 DIAGNOSIS — I82431 Acute embolism and thrombosis of right popliteal vein: Secondary | ICD-10-CM

## 2020-07-17 DIAGNOSIS — Z7901 Long term (current) use of anticoagulants: Secondary | ICD-10-CM | POA: Insufficient documentation

## 2020-07-17 LAB — COMPREHENSIVE METABOLIC PANEL
ALT: 33 U/L (ref 0–44)
AST: 28 U/L (ref 15–41)
Albumin: 4.3 g/dL (ref 3.5–5.0)
Alkaline Phosphatase: 47 U/L (ref 38–126)
Anion gap: 7 (ref 5–15)
BUN: 18 mg/dL (ref 6–20)
CO2: 28 mmol/L (ref 22–32)
Calcium: 9.4 mg/dL (ref 8.9–10.3)
Chloride: 102 mmol/L (ref 98–111)
Creatinine, Ser: 1.31 mg/dL — ABNORMAL HIGH (ref 0.61–1.24)
GFR, Estimated: >60 mL/min (ref >60)
Glucose, Bld: 98 mg/dL (ref 70–99)
Potassium: 4.2 mmol/L (ref 3.5–5.1)
Sodium: 137 mmol/L (ref 135–145)
Total Bilirubin: 0.8 mg/dL (ref 0.3–1.2)
Total Protein: 7.3 g/dL (ref 6.5–8.1)

## 2020-07-17 LAB — CBC WITH DIFFERENTIAL/PLATELET
Abs Immature Granulocytes: 0.02 10*3/uL (ref 0.00–0.07)
Basophils Absolute: 0 10*3/uL (ref 0.0–0.1)
Basophils Relative: 0 %
Eosinophils Absolute: 0 10*3/uL (ref 0.0–0.5)
Eosinophils Relative: 0 %
HCT: 40.5 % (ref 39.0–52.0)
Hemoglobin: 14.4 g/dL (ref 13.0–17.0)
Immature Granulocytes: 0 %
Lymphocytes Relative: 41 %
Lymphs Abs: 2.9 10*3/uL (ref 0.7–4.0)
MCH: 31.4 pg (ref 26.0–34.0)
MCHC: 35.6 g/dL (ref 30.0–36.0)
MCV: 88.2 fL (ref 80.0–100.0)
Monocytes Absolute: 0.6 10*3/uL (ref 0.1–1.0)
Monocytes Relative: 9 %
Neutro Abs: 3.5 10*3/uL (ref 1.7–7.7)
Neutrophils Relative %: 50 %
Platelets: 174 10*3/uL (ref 150–400)
RBC: 4.59 MIL/uL (ref 4.22–5.81)
RDW: 12.2 % (ref 11.5–15.5)
WBC: 7 10*3/uL (ref 4.0–10.5)
nRBC: 0 % (ref 0.0–0.2)

## 2020-07-17 NOTE — Progress Notes (Signed)
Patient here for follow up. No new concerns voiced.  °

## 2020-07-17 NOTE — Progress Notes (Signed)
Hematology/Oncology  note [facility] [phone] [phone]   Patient Care Team: Doreene Nest, NP as PCP - General (Internal Medicine)  REFERRING PROVIDER: Doreene Nest, NP  CHIEF COMPLAINTS/REASON FOR VISIT:  Follow up for acute lower extremity DVT and pulmonary embolism  HISTORY OF PRESENTING ILLNESS:   Eric Bolton is a  49 y.o.  male with PMH listed below was seen in consultation at the request of  Doreene Nest, NP  for evaluation of acute lower extremity DVT  12/10/2019 lower extremity venous ultrasound showed acute deep vein thrombosis involving the right popliteal vein, also age indeterminate DVT involving the right peroneal veins, right posterior tibial veins.  No DVT of left lower extremity.  Patient reports that patient noticed right lower extremity calf pain/ankle swelling 8 to 10 months prior to this ultrasound. He was in his usual state of health and discovered acute onset of right lower extremity pain and swelling.  She was treated for gout flare.  She was seen by primary care provider on 05/17/2019.  At that time the swelling persists, and was attributed to gout, lower extremity vein insufficiency.  Was recommended to use compression stocking.  The swelling of right lower extremity persist and he talked to a physician at work [place] and the physician recommended him to obtain ultrasound of lower extremity to rule out DVT. Ultrasound in April 2021 is positive for acute DVT and patient was started on Eliquis 10 mg twice daily for a week followed by Eliquis 5 mg twice daily.  Patient reports that lower extremity swelling has improved although he continues to have some edema at the end of the day.  No calf pain.  Patient denies any shortness of breath, chest pain. He tolerates anticoagulation.  No bleeding symptoms. Patient was referred to establish care with hematology for further evaluation and  management.  # Patient was last seen by me in June 2021 and was recommended to complete 3 to 6 months of anticoagulation and switch to long-term anticoagulation prophylaxis.  Patient has factor V Leiden heterozygous state. Patient had an appointment with me in August 2021 .  He was not able to make it and also run out of Eliquis.  Patient also had a long distance car drive and after that travel he developed right lower extremity pain and swelling.  He also experienced mild shortness of breath with exertion.  Patient went to emergency room on 04/17/2020 after the onset of the symptoms and was found to have extensive occlusive DVT throughout the right lower extremity extending from the common femoral vein into the calf.  CT also showed bilateral central pulmonary emboli with the most central embolus within the left main pulmonary artery.  The thrombus becomes nearly occlusive at the bifurcation of the lingula and the left lower lobe arteries.  Near occlusion pulmonary embolus in the distal right main pulmonary artery extending to the lobar branches of the upper and lower lobes.  Right heart strain.  Patient was started on heparin drip in the hospital.  04/20/2020, patient had catheter directed thrombolysis to the right popliteal and femoral veins, mechanical thrombectomy to the right popliteal, superficial femoral, common femoral veins.  He also had catheter directed thrombolysis and mechanical embolectomy left upper lobe and the left lobe pulmonary arteries.  He was also switched to Eliquis  BID x 7 days followed by Eliquis  BID.    INTERVAL HISTORY Eric Bolton is a 49 y.o. male  who has above history reviewed by me today presents for follow up visit for management of DVT and PE. Problems and complaints are listed below: Patient is on Eliquis 5 mg twice daily.  He reports tolerating well.  No bleeding events.  He continues to have right lower extremity swelling which is worse at the end of the day,  improves in the morning.  He wears compression stocking.  Review of Systems  Constitutional: Negative for appetite change, chills, fatigue, fever and unexpected weight change.  HENT:   Negative for hearing loss and voice change.   Eyes: Negative for eye problems and icterus.  Respiratory: Negative for chest tightness, cough and shortness of breath.   Cardiovascular: Negative for chest pain and leg swelling.  Gastrointestinal: Negative for abdominal distention and abdominal pain.  Endocrine: Negative for hot flashes.  Genitourinary: Negative for difficulty urinating, dysuria and frequency.   Musculoskeletal: Negative for arthralgias.       RLE swelling   Skin: Negative for itching and rash.  Neurological: Negative for light-headedness and numbness.  Hematological: Negative for adenopathy. Does not bruise/bleed easily.  Psychiatric/Behavioral: Negative for confusion.    MEDICAL HISTORY:  Past Medical History:  Diagnosis Date  . DVT (deep venous thrombosis) (HCC) 2020   right LE  . Embolus (HCC) 12/2019  . Pulmonary embolus (HCC) 04/2020    SURGICAL HISTORY: Past Surgical History:  Procedure Laterality Date  . HERNIA REPAIR  2009  . PULMONARY THROMBECTOMY N/A 04/20/2020   Procedure: PULMONARY THROMBECTOMY / THROMBOLYSIS POSSIBLE RIGHT LOWER EXTREMITY THROMBECTOMY / THROMBOLYSIS / POSSIBLE IVC FILTER;  Surgeon: Annice Needy, MD;  Location: [facility] INVASIVE CV LAB;  Service: Cardiovascular;  Laterality: N/A;  . VASECTOMY  1999    SOCIAL HISTORY: Social History   Socioeconomic History  . Marital status: Married    Spouse name: Dene  . Number of children: 3  . Years of education: Not on file  . Highest education level: Not on file  Occupational History  . Not on file  Tobacco Use  . Smoking status: Never Smoker  . Smokeless tobacco: Never Used  Vaping Use  . Vaping Use: Never used  Substance and Sexual Activity  . Alcohol use: Yes    Comment: twice a week, on the weekends   . Drug use: No  . Sexual activity: Yes    Birth control/protection: Surgical  Other Topics Concern  . Not on file  Social History Narrative   06/20/20   From: [place]    Living: with wife, Dene (1995)   Work: Copywriter, advertising      Family: 3 adult sons - Duwayne Heck, Gerilyn Pilgrim, Carmelina Noun and one grandson      Enjoys: watch and playing sports      Exercise: weight training, biking at work, running   Diet: pretty good      Safety   Seat belts: Yes    Guns: Yes  and secure   Safe in relationships: Yes       Social Determinants of Corporate investment banker Strain:   . Difficulty of Paying Living Expenses: Not on file  Food Insecurity:   . Worried About Programme researcher, broadcasting/film/video in the Last Year: Not on file  . Ran Out of Food in the Last Year: Not on file  Transportation Needs:   . Lack of Transportation (Medical): Not on file  . Lack of Transportation (Non-Medical): Not on file  Physical Activity:   . Days of  Exercise per Week: Not on file  . Minutes of Exercise per Session: Not on file  Stress:   . Feeling of Stress : Not on file  Social Connections:   . Frequency of Communication with Friends and Family: Not on file  . Frequency of Social Gatherings with Friends and Family: Not on file  . Attends Religious Services: Not on file  . Active Member of Clubs or Organizations: Not on file  . Attends BankerClub or Organization Meetings: Not on file  . Marital Status: Not on file  Intimate Partner Violence:   . Fear of Current or Ex-Partner: Not on file  . Emotionally Abused: Not on file  . Physically Abused: Not on file  . Sexually Abused: Not on file    FAMILY HISTORY: Family History  Problem Relation Age of Onset  . Early death Mother        car accident  . Factor V Leiden deficiency Father   . Pulmonary embolism Father   . Factor V Leiden deficiency Brother     ALLERGIES:  has No Known Allergies.  MEDICATIONS:  Current Outpatient Medications  Medication  Sig Dispense Refill  . apixaban (ELIQUIS) 5 MG TABS tablet Take 1 tablet (5 mg total) by mouth 2 (two) times daily. 60 tablet 11   No current facility-administered medications for this visit.     PHYSICAL EXAMINATION: ECOG PERFORMANCE STATUS: 0 - Asymptomatic Vitals:   07/17/20 1122  BP: 132/80  Pulse: 64  Temp: 97.9 F (36.6 C)   There were no vitals filed for this visit.  Physical Exam Constitutional:      General: He is not in acute distress. HENT:     Head: Normocephalic and atraumatic.  Eyes:     General: No scleral icterus. Cardiovascular:     Rate and Rhythm: Normal rate and regular rhythm.     Heart sounds: Normal heart sounds.  Pulmonary:     Effort: Pulmonary effort is normal. No respiratory distress.     Breath sounds: No wheezing.  Abdominal:     General: Bowel sounds are normal. There is no distension.     Palpations: Abdomen is soft.  Musculoskeletal:        General: No deformity. Normal range of motion.     Cervical back: Normal range of motion and neck supple.     Comments: Right lower extremity with compression stock.   Skin:    General: Skin is warm and dry.     Findings: No erythema or rash.  Neurological:     Mental Status: He is alert and oriented to person, place, and time. Mental status is at baseline.     Cranial Nerves: No cranial nerve deficit.     Coordination: Coordination normal.  Psychiatric:        Mood and Affect: Mood normal.     LABORATORY DATA:  I have reviewed the data as listed Lab Results  Component Value Date   WBC 7.0 07/17/2020   HGB 14.4 07/17/2020   HCT 40.5 07/17/2020   MCV 88.2 07/17/2020   PLT 174 07/17/2020   Recent Labs    01/25/20 1214 01/25/20 1214 04/17/20 1034 04/18/20 0407 07/17/20 1015  NA 138   < > 137 138 137  K 4.3   < > 3.9 4.3 4.2  CL 105   < > 103 104 102  CO2 26   < > 26 26 28   GLUCOSE 85   < > 109* 106* 98  BUN 23*   < > 16 15 18   CREATININE 1.35*   < > 1.28* 1.20 1.31*  CALCIUM 9.2    < > 9.0 9.3 9.4  GFRNONAA >60   < > >60 >60 >60  GFRAA >60  --  >60 >60  --   PROT 7.4  --  7.4  --  7.3  ALBUMIN 4.5  --  4.4  --  4.3  AST 31  --  37  --  28  ALT 32  --  40  --  33  ALKPHOS 44  --  56  --  47  BILITOT 0.8  --  1.1  --  0.8   < > = values in this interval not displayed.   Iron/TIBC/Ferritin/ %Sat No results found for: IRON, TIBC, FERRITIN, IRONPCTSAT    RADIOGRAPHIC STUDIES: I have personally reviewed the radiological images as listed and agreed with the findings in the report. PERIPHERAL VASCULAR CATHETERIZATION  Result Date: 04/20/2020 See op note  VAS 06/20/2020 LOWER EXTREMITY VENOUS (DVT)  Result Date: 05/16/2020  Lower Venous DVTStudy Indications: Pain, and Swelling.  Risk Factors: DVT Right lower extremity Surgery 04/20/2020 Mechanical thrombectomy of right CFV, SFV, and popliteal veins. Comparison Study: 04/17/2020 Performing Technologist: 06/17/2020 RT (R)(VS)  Examination Guidelines: A complete evaluation includes B-mode imaging, spectral Doppler, color Doppler, and power Doppler as needed of all accessible portions of each vessel. Bilateral testing is considered an integral part of a complete examination. Limited examinations for reoccurring indications may be performed as noted. The reflux portion of the exam is performed with the patient in reverse Trendelenburg.  +---------+---------------+---------+-----------+----------+--------------+ RIGHT    CompressibilityPhasicitySpontaneityPropertiesThrombus Aging +---------+---------------+---------+-----------+----------+--------------+ CFV      Partial                                                     +---------+---------------+---------+-----------+----------+--------------+ SFJ      Full                                                        +---------+---------------+---------+-----------+----------+--------------+ FV Prox  None                                                         +---------+---------------+---------+-----------+----------+--------------+ FV Mid   None                                                        +---------+---------------+---------+-----------+----------+--------------+ FV DistalNone                                                        +---------+---------------+---------+-----------+----------+--------------+ PFV      Full                                                        +---------+---------------+---------+-----------+----------+--------------+  POP      Partial                                                     +---------+---------------+---------+-----------+----------+--------------+ PTV      None                                                        +---------+---------------+---------+-----------+----------+--------------+ PERO     Partial                                                     +---------+---------------+---------+-----------+----------+--------------+ SSV      Full                                                        +---------+---------------+---------+-----------+----------+--------------+  Summary: RIGHT: - Findings consistent with chronic deep vein thrombosis involving the right common femoral vein, right femoral vein, right popliteal vein, and right posterior tibial veins. - Findings appear essentially unchanged compared to previous examination. - Right SSV wall thickening.  *See table(s) above for measurements and observations. Electronically signed by Festus Barren MD on 05/16/2020 at 11:40:34 AM.    Final       ASSESSMENT & PLAN:  1. Acute deep vein thrombosis (DVT) of femoral vein of right lower extremity (HCC)   2. Heterozygous factor V Leiden mutation (HCC)   3. Post-thrombotic syndrome of right lower extremity    Recurrent acute RLE DVT and acute PE Heterozygous Factor V Leiden mutation So far he has been on Eliquis 5 mg twice daily since 04/17/2020, I recommend him to  complete another 3 months of treatment then I recommend patient to be switched to Eliquis 2.5 mg twice daily. He agrees with the plan. 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  I recommend patient with wrist bracelet for anticoagulation.  Post thrombotic syndrome of right lower extremity, Continue compression stocking.  Recommend leg elevation frequently.   Orders Placed This Encounter  Procedures  . CBC with Differential/Platelet    Standing Status:   Future    Standing Expiration Date:   07/17/2021  . Comprehensive metabolic panel    Standing Status:   Future    Standing Expiration Date:   07/17/2021    All questions were answered. The patient knows to call the clinic with any problems questions or concerns.  cc Doreene Nest, NP    Return of visit: 3 months Thank you for this kind referral and the opportunity to participate in the care of this patient. A copy of today's note is routed to referring provider    Rickard Patience, MD, PhD Hematology Oncology [facility] Pager- [phone] 07/17/2020

## 2020-11-14 ENCOUNTER — Ambulatory Visit (INDEPENDENT_AMBULATORY_CARE_PROVIDER_SITE_OTHER): Payer: 59 | Admitting: Nurse Practitioner

## 2020-11-16 ENCOUNTER — Ambulatory Visit (INDEPENDENT_AMBULATORY_CARE_PROVIDER_SITE_OTHER): Payer: 59 | Admitting: Nurse Practitioner

## 2020-11-27 ENCOUNTER — Ambulatory Visit: Payer: 59 | Admitting: Oncology

## 2020-11-27 ENCOUNTER — Other Ambulatory Visit: Payer: 59

## 2020-11-29 ENCOUNTER — Inpatient Hospital Stay: Payer: 59 | Attending: Oncology

## 2020-11-29 ENCOUNTER — Encounter: Payer: Self-pay | Admitting: Oncology

## 2020-11-29 ENCOUNTER — Inpatient Hospital Stay (HOSPITAL_BASED_OUTPATIENT_CLINIC_OR_DEPARTMENT_OTHER): Payer: 59 | Admitting: Oncology

## 2020-11-29 VITALS — BP 127/99 | HR 62 | Temp 96.5°F | Resp 16 | Wt 210.0 lb

## 2020-11-29 DIAGNOSIS — Z7901 Long term (current) use of anticoagulants: Secondary | ICD-10-CM | POA: Diagnosis not present

## 2020-11-29 DIAGNOSIS — D6851 Activated protein C resistance: Secondary | ICD-10-CM | POA: Insufficient documentation

## 2020-11-29 DIAGNOSIS — I87001 Postthrombotic syndrome without complications of right lower extremity: Secondary | ICD-10-CM | POA: Diagnosis not present

## 2020-11-29 DIAGNOSIS — Z86718 Personal history of other venous thrombosis and embolism: Secondary | ICD-10-CM | POA: Insufficient documentation

## 2020-11-29 DIAGNOSIS — Z86711 Personal history of pulmonary embolism: Secondary | ICD-10-CM | POA: Insufficient documentation

## 2020-11-29 DIAGNOSIS — I82411 Acute embolism and thrombosis of right femoral vein: Secondary | ICD-10-CM | POA: Diagnosis present

## 2020-11-29 LAB — CBC WITH DIFFERENTIAL/PLATELET
Abs Immature Granulocytes: 0.02 10*3/uL (ref 0.00–0.07)
Basophils Absolute: 0 10*3/uL (ref 0.0–0.1)
Basophils Relative: 1 %
Eosinophils Absolute: 0.1 10*3/uL (ref 0.0–0.5)
Eosinophils Relative: 1 %
HCT: 43.6 % (ref 39.0–52.0)
Hemoglobin: 15.7 g/dL (ref 13.0–17.0)
Immature Granulocytes: 0 %
Lymphocytes Relative: 48 %
Lymphs Abs: 3.2 10*3/uL (ref 0.7–4.0)
MCH: 32.3 pg (ref 26.0–34.0)
MCHC: 36 g/dL (ref 30.0–36.0)
MCV: 89.7 fL (ref 80.0–100.0)
Monocytes Absolute: 0.6 10*3/uL (ref 0.1–1.0)
Monocytes Relative: 8 %
Neutro Abs: 2.8 10*3/uL (ref 1.7–7.7)
Neutrophils Relative %: 42 %
Platelets: 190 10*3/uL (ref 150–400)
RBC: 4.86 MIL/uL (ref 4.22–5.81)
RDW: 12.3 % (ref 11.5–15.5)
WBC: 6.6 10*3/uL (ref 4.0–10.5)
nRBC: 0 % (ref 0.0–0.2)

## 2020-11-29 LAB — COMPREHENSIVE METABOLIC PANEL
ALT: 38 U/L (ref 0–44)
AST: 30 U/L (ref 15–41)
Albumin: 4.8 g/dL (ref 3.5–5.0)
Alkaline Phosphatase: 54 U/L (ref 38–126)
Anion gap: 9 (ref 5–15)
BUN: 13 mg/dL (ref 6–20)
CO2: 25 mmol/L (ref 22–32)
Calcium: 9.5 mg/dL (ref 8.9–10.3)
Chloride: 104 mmol/L (ref 98–111)
Creatinine, Ser: 1.26 mg/dL — ABNORMAL HIGH (ref 0.61–1.24)
GFR, Estimated: >60 mL/min (ref >60)
Glucose, Bld: 98 mg/dL (ref 70–99)
Potassium: 4.3 mmol/L (ref 3.5–5.1)
Sodium: 138 mmol/L (ref 135–145)
Total Bilirubin: 0.7 mg/dL (ref 0.3–1.2)
Total Protein: 7.7 g/dL (ref 6.5–8.1)

## 2020-11-29 MED ORDER — APIXABAN 2.5 MG PO TABS: 2.5000 mg | ORAL_TABLET | Freq: Two times a day (BID) | ORAL | 1 refills | Status: DC

## 2020-11-29 NOTE — Progress Notes (Signed)
Hematology/Oncology  note [facility] Telephone:([phone] Fax:([phone]   Patient Care Team: Doreene Nest, NP as PCP - General (Internal Medicine)  REFERRING PROVIDER: Doreene Nest, NP  CHIEF COMPLAINTS/REASON FOR VISIT:  Follow up for acute lower extremity DVT and pulmonary embolism  HISTORY OF PRESENTING ILLNESS:   Eric Bolton is a  50 y.o.  male with PMH listed below was seen in consultation at the request of  Doreene Nest, NP  for evaluation of acute lower extremity DVT  12/10/2019 lower extremity venous ultrasound showed acute deep vein thrombosis involving the right popliteal vein, also age indeterminate DVT involving the right peroneal veins, right posterior tibial veins.  No DVT of left lower extremity.  Patient reports that patient noticed right lower extremity calf pain/ankle swelling 8 to 10 months prior to this ultrasound. He was in his usual state of health and discovered acute onset of right lower extremity pain and swelling.  She was treated for gout flare.  She was seen by primary care provider on 05/17/2019.  At that time the swelling persists, and was attributed to gout, lower extremity vein insufficiency.  Was recommended to use compression stocking.  The swelling of right lower extremity persist and he talked [place] physician [place] and the physician recommended him to obtain ultrasound of lower extremity to rule out DVT. Ultrasound in April 2021 is positive for acute DVT and patient was started on Eliquis 10 mg twice daily for a week followed by Eliquis 5 mg twice daily.  Patient reports that lower extremity swelling has improved although he continues to have some edema at the end of the day.  No calf pain.  Patient denies any shortness of breath, chest pain. He tolerates anticoagulation.  No bleeding symptoms. Patient was referred to establish care with hematology for further evaluation and  management.  # Patient was last seen by me in June 2021 and was recommended to complete 3 to 6 months of anticoagulation and switch to long-term anticoagulation prophylaxis.  Patient has factor V Leiden heterozygous state. Patient had an appointment with me in August 2021 .  He was not able to make it and also run out of Eliquis.  Patient also had a long distance car drive and after that travel he developed right lower extremity pain and swelling.  He also experienced mild shortness of breath with exertion.  Patient went to emergency room on 04/17/2020 after the onset of the symptoms and was found to have extensive occlusive DVT throughout the right lower extremity extending from the common femoral vein into the calf.  CT also showed bilateral central pulmonary emboli with the most central embolus within the left main pulmonary artery.  The thrombus becomes nearly occlusive at the bifurcation of the lingula and the left lower lobe arteries.  Near occlusion pulmonary embolus in the distal right main pulmonary artery extending to the lobar branches of the upper and lower lobes.  Right heart strain.  Patient was started on heparin drip in the hospital.  04/20/2020, patient had catheter directed thrombolysis to the right popliteal and femoral veins, mechanical thrombectomy to the right popliteal, superficial femoral, common femoral veins.  He also had catheter directed thrombolysis and mechanical embolectomy left upper lobe and the left lobe pulmonary arteries.  He was also switched to Eliquis 10mg  BID x 7 days followed by Eliquis 5mg  BID.  04/17/2020 started Eliquis anticoagulation.  INTERVAL HISTORY Eric Bolton is a  50 y.o. male who has above history reviewed by me today presents for follow up visit for management of DVT and PE. Problems and complaints are listed below: Patient is on Eliquis 5 mg twice daily.   Patient denies any excessive bleeding. Denies any shortness of breath, chest pain, lower  extremity swelling.  Review of Systems  Constitutional: Negative for appetite change, chills, fatigue, fever and unexpected weight change.  HENT:   Negative for hearing loss and voice change.   Eyes: Negative for eye problems and icterus.  Respiratory: Negative for chest tightness, cough and shortness of breath.   Cardiovascular: Negative for chest pain and leg swelling.  Gastrointestinal: Negative for abdominal distention and abdominal pain.  Endocrine: Negative for hot flashes.  Genitourinary: Negative for difficulty urinating, dysuria and frequency.   Musculoskeletal: Negative for arthralgias.       RLE swelling   Skin: Negative for itching and rash.  Neurological: Negative for light-headedness and numbness.  Hematological: Negative for adenopathy. Does not bruise/bleed easily.  Psychiatric/Behavioral: Negative for confusion.    MEDICAL HISTORY:  Past Medical History:  Diagnosis Date  . DVT (deep venous thrombosis) (HCC) 2020   right LE  . Embolus (HCC) 12/2019  . Pulmonary embolus (HCC) 04/2020    SURGICAL HISTORY: Past Surgical History:  Procedure Laterality Date  . HERNIA REPAIR  2009  . PULMONARY THROMBECTOMY N/A 04/20/2020   Procedure: PULMONARY THROMBECTOMY / THROMBOLYSIS POSSIBLE RIGHT LOWER EXTREMITY THROMBECTOMY / THROMBOLYSIS / POSSIBLE IVC FILTER;  Surgeon: Annice Needyew, Jason S, MD;  Location: [facility] INVASIVE CV LAB;  Service: Cardiovascular;  Laterality: N/A;  . VASECTOMY  1999    SOCIAL HISTORY: Social History   Socioeconomic History  . Marital status: Married    Spouse name: Dene  . Number of children: 3  . Years of education: Not on file  . Highest education level: Not on file  Occupational History  . Not on file  Tobacco Use  . Smoking status: Never Smoker  . Smokeless tobacco: Never Used  Vaping Use  . Vaping Use: Never used  Substance and Sexual Activity  . Alcohol use: Yes    Comment: twice a week, on the weekends  . Drug use: No  . Sexual activity:  Yes    Birth control/protection: Surgical  Other Topics Concern  . 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      Family: 3 adult sons - Duwayne Hecksaiah, Gerilyn PilgrimJacob, Carmelina NounOctavus and one grandson      Enjoys: watch and playing sports      Exercise: weight training, biking at work, running   Diet: pretty good      Safety   Seat belts: Yes    Guns: Yes  and secure   Safe in relationships: Yes       Social Determinants of Corporate investment bankerHealth   Financial Resource Strain: Not on file  Food Insecurity: Not on file  Transportation Needs: Not on file  Physical Activity: Not on file  Stress: Not on file  Social Connections: Not on file  Intimate Partner Violence: Not on file    FAMILY HISTORY: Family History  Problem Relation Age of Onset  . Early death Mother        car accident  . Factor V Leiden deficiency Father   . Pulmonary embolism Father   . Factor V Leiden deficiency Brother  ALLERGIES:  has No Known Allergies.  MEDICATIONS:  Current Outpatient Medications  Medication Sig Dispense Refill  . apixaban (ELIQUIS) 2.5 MG TABS tablet Take 1 tablet (2.5 mg total) by mouth 2 (two) times daily. 180 tablet 1   No current facility-administered medications for this visit.     PHYSICAL EXAMINATION: ECOG PERFORMANCE STATUS: 0 - Asymptomatic Vitals:   11/29/20 1057  BP: (!) 127/99  Pulse: 62  Resp: 16  Temp: (!) 96.5 F (35.8 C)  SpO2: 100%   Filed Weights   11/29/20 1057  Weight: 210 lb (95.3 kg)    Physical Exam Constitutional:      General: He is not in acute distress. HENT:     Head: Normocephalic and atraumatic.  Eyes:     General: No scleral icterus. Cardiovascular:     Rate and Rhythm: Normal rate and regular rhythm.     Heart sounds: Normal heart sounds.  Pulmonary:     Effort: Pulmonary effort is normal. No respiratory distress.     Breath sounds: No wheezing.   Abdominal:     General: Bowel sounds are normal. There is no distension.     Palpations: Abdomen is soft.  Musculoskeletal:        General: No deformity. Normal range of motion.     Cervical back: Normal range of motion and neck supple.  Skin:    General: Skin is warm and dry.     Findings: No erythema or rash.  Neurological:     Mental Status: He is alert and oriented to person, place, and time. Mental status is at baseline.     Cranial Nerves: No cranial nerve deficit.     Coordination: Coordination normal.  Psychiatric:        Mood and Affect: Mood normal.     LABORATORY DATA:  I have reviewed the data as listed Lab Results  Component Value Date   WBC 6.6 11/29/2020   HGB 15.7 11/29/2020   HCT 43.6 11/29/2020   MCV 89.7 11/29/2020   PLT 190 11/29/2020   Recent Labs    01/25/20 1214 04/17/20 1034 04/18/20 0407 07/17/20 1015 11/29/20 1003  NA 138 137 138 137 138  K 4.3 3.9 4.3 4.2 4.3  CL 105 103 104 102 104  CO2 26 26 26 28 25   GLUCOSE 85 109* 106* 98 98  BUN 23* 16 15 18 13   CREATININE 1.35* 1.28* 1.20 1.31* 1.26*  CALCIUM 9.2 9.0 9.3 9.4 9.5  GFRNONAA >60 >60 >60 >60 >60  GFRAA >60 >60 >60  --   --   PROT 7.4 7.4  --  7.3 7.7  ALBUMIN 4.5 4.4  --  4.3 4.8  AST 31 37  --  28 30  ALT 32 40  --  33 38  ALKPHOS 44 56  --  47 54  BILITOT 0.8 1.1  --  0.8 0.7   Iron/TIBC/Ferritin/ %Sat No results found for: IRON, TIBC, FERRITIN, IRONPCTSAT    RADIOGRAPHIC STUDIES: I have personally reviewed the radiological images as listed and agreed with the findings in the report. No results found.    ASSESSMENT & PLAN:  1. History of DVT (deep vein thrombosis)   2. Heterozygous factor V Leiden mutation (HCC)   3. Post-thrombotic syndrome of right lower extremity   4. History of pulmonary embolism    Recurrent acute RLE DVT and acute PE Heterozygous Factor V Leiden mutation Patient has finished 6 months of anticoagulation. Recommend patient to switch  to  Eliquis 2.5 mg twice daily. Patient reports that he has 1 months of Eliquis 5 mg tablets supply and prefers to finish this and then switch to Eliquis.  A prescription of Eliquis 2.5 mg twice daily was sent to pharmacy. Bleeding risk was discussed with patient Post thrombotic syndrome of right lower extremity, Symptoms are stable.  Encourage compression stocking  All questions were answered. The patient knows to call the clinic with any problems questions or concerns.  cc Doreene Nest, NP    Return of visit: 6 months   Rickard Patience, MD, PhD Hematology Oncology [facility] at [facility] Pager- [phone] 11/29/2020

## 2020-11-29 NOTE — Progress Notes (Signed)
Pt in for follow up, denies any difficulties or concerns today. 

## 2021-05-21 ENCOUNTER — Other Ambulatory Visit: Payer: Self-pay | Admitting: Oncology

## 2021-05-31 ENCOUNTER — Other Ambulatory Visit: Payer: Self-pay | Admitting: *Deleted

## 2021-05-31 DIAGNOSIS — Z86711 Personal history of pulmonary embolism: Secondary | ICD-10-CM

## 2021-05-31 DIAGNOSIS — Z86718 Personal history of other venous thrombosis and embolism: Secondary | ICD-10-CM

## 2021-06-04 ENCOUNTER — Inpatient Hospital Stay: Payer: 59 | Attending: Oncology

## 2021-06-04 ENCOUNTER — Other Ambulatory Visit: Payer: Self-pay

## 2021-06-04 ENCOUNTER — Encounter: Payer: Self-pay | Admitting: Oncology

## 2021-06-04 ENCOUNTER — Inpatient Hospital Stay (HOSPITAL_BASED_OUTPATIENT_CLINIC_OR_DEPARTMENT_OTHER): Payer: 59 | Admitting: Oncology

## 2021-06-04 VITALS — BP 134/95 | HR 63 | Temp 96.2°F | Resp 18 | Wt 223.6 lb

## 2021-06-04 DIAGNOSIS — Z7901 Long term (current) use of anticoagulants: Secondary | ICD-10-CM | POA: Insufficient documentation

## 2021-06-04 DIAGNOSIS — D6851 Activated protein C resistance: Secondary | ICD-10-CM

## 2021-06-04 DIAGNOSIS — Z86718 Personal history of other venous thrombosis and embolism: Secondary | ICD-10-CM

## 2021-06-04 DIAGNOSIS — Z86711 Personal history of pulmonary embolism: Secondary | ICD-10-CM | POA: Insufficient documentation

## 2021-06-04 DIAGNOSIS — I87001 Postthrombotic syndrome without complications of right lower extremity: Secondary | ICD-10-CM | POA: Diagnosis not present

## 2021-06-04 LAB — CBC WITH DIFFERENTIAL/PLATELET
Abs Immature Granulocytes: 0.02 10*3/uL (ref 0.00–0.07)
Basophils Absolute: 0 10*3/uL (ref 0.0–0.1)
Basophils Relative: 1 %
Eosinophils Absolute: 0 10*3/uL (ref 0.0–0.5)
Eosinophils Relative: 1 %
HCT: 40.3 % (ref 39.0–52.0)
Hemoglobin: 14.6 g/dL (ref 13.0–17.0)
Immature Granulocytes: 0 %
Lymphocytes Relative: 38 %
Lymphs Abs: 2.5 10*3/uL (ref 0.7–4.0)
MCH: 32 pg (ref 26.0–34.0)
MCHC: 36.2 g/dL — ABNORMAL HIGH (ref 30.0–36.0)
MCV: 88.4 fL (ref 80.0–100.0)
Monocytes Absolute: 0.6 10*3/uL (ref 0.1–1.0)
Monocytes Relative: 9 %
Neutro Abs: 3.4 10*3/uL (ref 1.7–7.7)
Neutrophils Relative %: 51 %
Platelets: 186 10*3/uL (ref 150–400)
RBC: 4.56 MIL/uL (ref 4.22–5.81)
RDW: 12.2 % (ref 11.5–15.5)
WBC: 6.6 10*3/uL (ref 4.0–10.5)
nRBC: 0 % (ref 0.0–0.2)

## 2021-06-04 LAB — COMPREHENSIVE METABOLIC PANEL
ALT: 62 U/L — ABNORMAL HIGH (ref 0–44)
AST: 41 U/L (ref 15–41)
Albumin: 4.6 g/dL (ref 3.5–5.0)
Alkaline Phosphatase: 47 U/L (ref 38–126)
Anion gap: 5 (ref 5–15)
BUN: 13 mg/dL (ref 6–20)
CO2: 28 mmol/L (ref 22–32)
Calcium: 9.3 mg/dL (ref 8.9–10.3)
Chloride: 102 mmol/L (ref 98–111)
Creatinine, Ser: 1.22 mg/dL (ref 0.61–1.24)
GFR, Estimated: >60 mL/min (ref >60)
Glucose, Bld: 109 mg/dL — ABNORMAL HIGH (ref 70–99)
Potassium: 4 mmol/L (ref 3.5–5.1)
Sodium: 135 mmol/L (ref 135–145)
Total Bilirubin: 0.8 mg/dL (ref 0.3–1.2)
Total Protein: 7.6 g/dL (ref 6.5–8.1)

## 2021-06-04 NOTE — Progress Notes (Signed)
Pt here for follow up. He reports frequency in right leg and right ankle numbness.

## 2021-06-04 NOTE — Progress Notes (Signed)
Hematology/Oncology  note [facility] Telephone:([phone] Fax:([phone]   Patient Care Team: Doreene Nest, NP as PCP - General (Internal Medicine)  REFERRING PROVIDER: Doreene Nest, NP  CHIEF COMPLAINTS/REASON FOR VISIT:  Follow up for acute lower extremity DVT and pulmonary embolism  HISTORY OF PRESENTING ILLNESS:   Eric Bolton is a  50 y.o.  male with PMH listed below was seen in consultation at the request of  Doreene Nest, NP  for evaluation of acute lower extremity DVT  12/10/2019 lower extremity venous ultrasound showed acute deep vein thrombosis involving the right popliteal vein, also age indeterminate DVT involving the right peroneal veins, right posterior tibial veins.  No DVT of left lower extremity.  Patient reports that patient noticed right lower extremity calf pain/ankle swelling 8 to 10 months prior to this ultrasound. He was in his usual state of health and discovered acute onset of right lower extremity pain and swelling.  She was treated for gout flare.  She was seen by primary care provider on 05/17/2019.  At that time the swelling persists, and was attributed to gout, lower extremity vein insufficiency.  Was recommended to use compression stocking.  The swelling of right lower extremity persist and he talked to a physician at work [place] and the physician recommended him to obtain ultrasound of lower extremity to rule out DVT. Ultrasound in April 2021 is positive for acute DVT and patient was started on Eliquis 10 mg twice daily for a week followed by Eliquis 5 mg twice daily.  Patient reports that lower extremity swelling has improved although he continues to have some edema at the end of the day.  No calf pain.  Patient denies any shortness of breath, chest pain. He tolerates anticoagulation.  No bleeding symptoms. Patient was referred to establish care with hematology for further evaluation and  management.  # Patient was last seen by me in June 2021 and was recommended to complete 3 to 6 months of anticoagulation and switch to long-term anticoagulation prophylaxis.  Patient has factor V Leiden heterozygous state. Patient had an appointment with me in August 2021 .  He was not able to make it and also run out of Eliquis.  Patient also had a long distance car drive and after that travel he developed right lower extremity pain and swelling.  He also experienced mild shortness of breath with exertion.  Patient went to emergency room on 04/17/2020 after the onset of the symptoms and was found to have extensive occlusive DVT throughout the right lower extremity extending from the common femoral vein into the calf.  CT also showed bilateral central pulmonary emboli with the most central embolus within the left main pulmonary artery.  The thrombus becomes nearly occlusive at the bifurcation of the lingula and the left lower lobe arteries.  Near occlusion pulmonary embolus in the distal right main pulmonary artery extending to the lobar branches of the upper and lower lobes.  Right heart strain.  Patient was started on heparin drip in the hospital.  04/20/2020, patient had catheter directed thrombolysis to the right popliteal and femoral veins, mechanical thrombectomy to the right popliteal, superficial femoral, common femoral veins.  He also had catheter directed thrombolysis and mechanical embolectomy left upper lobe and the left lobe pulmonary arteries.  He was also switched to Eliquis 10mg  BID x 7 days followed by Eliquis 5mg  BID.  04/17/2020 started Eliquis anticoagulation.  INTERVAL HISTORY [name] is a  50 y.o. male who has above history reviewed by me today presents for follow up visit for management of DVT and PE. Problems and complaints are listed below: Patient is on Eliquis 5 mg twice daily.   During last visit, patient was recommended to go on Eliquis 2.5 mg twice daily.  Due to patient  has 22-month supply, he is currently still on 5 mg twice daily.  Tolerates well.  Denies any shortness of breath, chest pain, bleeding. He continues to have chronic right lower extremity swelling, he wears compression stocking. Sometimes he noticed numbness tingling around his ankle especially after wearing compression stocking for a long time.   Review of Systems  Constitutional:  Negative for appetite change, chills, fatigue, fever and unexpected weight change.  HENT:   Negative for hearing loss and voice change.   Eyes:  Negative for eye problems and icterus.  Respiratory:  Negative for chest tightness, cough and shortness of breath.   Cardiovascular:  Negative for chest pain and leg swelling.  Gastrointestinal:  Negative for abdominal distention and abdominal pain.  Endocrine: Negative for hot flashes.  Genitourinary:  Negative for difficulty urinating, dysuria and frequency.   Musculoskeletal:  Negative for arthralgias.       RLE swelling   Skin:  Negative for itching and rash.  Neurological:  Negative for light-headedness and numbness.  Hematological:  Negative for adenopathy. Does not bruise/bleed easily.  Psychiatric/Behavioral:  Negative for confusion.    MEDICAL HISTORY:  Past Medical History:  Diagnosis Date   DVT (deep venous thrombosis) (HCC) 2020   right LE   Embolus (HCC) 12/2019   Pulmonary embolus (HCC) 04/2020    SURGICAL HISTORY: Past Surgical History:  Procedure Laterality Date   HERNIA REPAIR  2009   PULMONARY THROMBECTOMY N/A 04/20/2020   Procedure: PULMONARY THROMBECTOMY / THROMBOLYSIS POSSIBLE RIGHT LOWER EXTREMITY THROMBECTOMY / THROMBOLYSIS / POSSIBLE IVC FILTER;  Surgeon: Annice Needy, MD;  Location: [facility] INVASIVE CV LAB;  Service: Cardiovascular;  Laterality: N/A;   VASECTOMY  1999    SOCIAL HISTORY: Social History   Socioeconomic History   Marital status: Married    Spouse name: Dene   Number of children: 3   Years of education: Not on file    Highest education level: Not on file  Occupational History   Not on file  Tobacco Use   Smoking status: Never   Smokeless tobacco: Never  Vaping Use   Vaping Use: Never used  Substance and Sexual Activity   Alcohol use: Yes    Comment: twice a week, on the weekends   Drug use: No   Sexual activity: Yes    Birth control/protection: Surgical  Other Topics Concern   Not on file  Social History Narrative   06/20/20   From: [place]    Living: with wife, Dene (1995)   Work: Copywriter, advertising      Family: 3 adult sons - Duwayne Heck, Gerilyn Pilgrim, Carmelina Noun and one grandson      Enjoys: watch and playing sports      Exercise: weight training, biking at work, running   Diet: pretty good      Safety   Seat belts: Yes    Guns: Yes  and secure   Safe in relationships: Yes       Social Determinants of Corporate investment banker Strain: Not on file  Food Insecurity: Not on file  Transportation Needs: Not on file  Physical Activity: Not on file  Stress: Not on file  Social Connections: Not on file  Intimate Partner Violence: Not on file    FAMILY HISTORY: Family History  Problem Relation Age of Onset   Early death Mother        car accident   Factor V Leiden deficiency Father    Pulmonary embolism Father    Factor V Leiden deficiency Brother     ALLERGIES:  has No Known Allergies.  MEDICATIONS:  Current Outpatient Medications  Medication Sig Dispense Refill   apixaban (ELIQUIS) 2.5 MG TABS tablet Take 1 tablet (2.5 mg total) by mouth 2 (two) times daily. (Patient not taking: Reported on 06/04/2021) 180 tablet 1   No current facility-administered medications for this visit.     PHYSICAL EXAMINATION: ECOG PERFORMANCE STATUS: 0 - Asymptomatic Vitals:   06/04/21 0955  BP: (!) 134/95  Pulse: 63  Resp: 18  Temp: (!) 96.2 F (35.7 C)   Filed Weights   06/04/21 0955  Weight: 223 lb 9.6 oz (101.4 kg)    Physical Exam Constitutional:      General: He is  not in acute distress. HENT:     Head: Normocephalic and atraumatic.  Eyes:     General: No scleral icterus. Cardiovascular:     Rate and Rhythm: Normal rate and regular rhythm.     Heart sounds: Normal heart sounds.  Pulmonary:     Effort: Pulmonary effort is normal. No respiratory distress.     Breath sounds: No wheezing.  Abdominal:     General: Bowel sounds are normal. There is no distension.     Palpations: Abdomen is soft.  Musculoskeletal:        General: Swelling present. No deformity. Normal range of motion.     Cervical back: Normal range of motion and neck supple.     Comments: Right lower extremity edema  Skin:    General: Skin is warm and dry.     Findings: No erythema or rash.  Neurological:     Mental Status: He is alert and oriented to person, place, and time. Mental status is at baseline.     Cranial Nerves: No cranial nerve deficit.     Coordination: Coordination normal.  Psychiatric:        Mood and Affect: Mood normal.    LABORATORY DATA:  I have reviewed the data as listed Lab Results  Component Value Date   WBC 6.6 06/04/2021   HGB 14.6 06/04/2021   HCT 40.3 06/04/2021   MCV 88.4 06/04/2021   PLT 186 06/04/2021   Recent Labs    07/17/20 1015 11/29/20 1003 06/04/21 0939  NA 137 138 135  K 4.2 4.3 4.0  CL 102 104 102  CO2 28 25 28   GLUCOSE 98 98 109*  BUN 18 13 13   CREATININE 1.31* 1.26* 1.22  CALCIUM 9.4 9.5 9.3  GFRNONAA >60 >60 >60  PROT 7.3 7.7 7.6  ALBUMIN 4.3 4.8 4.6  AST 28 30 41  ALT 33 38 62*  ALKPHOS 47 54 47  BILITOT 0.8 0.7 0.8    Iron/TIBC/Ferritin/ %Sat No results found for: IRON, TIBC, FERRITIN, IRONPCTSAT    RADIOGRAPHIC STUDIES: I have personally reviewed the radiological images as listed and agreed with the findings in the report. No results found.    ASSESSMENT & PLAN:  1. History of DVT (deep vein thrombosis)   2. History of pulmonary embolism   3. Heterozygous factor V Leiden mutation (HCC)   4.  Post-thrombotic syndrome of  right lower extremity    Recurrent acute RLE DVT and acute PE Heterozygous Factor V Leiden mutation Patient has finished 12 months of anticoagulation. Recommend patient to switch to Eliquis 2.5 mg twice daily.  Post thrombotic syndrome of the right lower extremity.  Numbness and tingling could be due to impaired circulation due to compression stocking.  Recommend patient to follow-up with Dr. Wyn Quaker for further evaluation.  All questions were answered. The patient knows to call the clinic with any problems questions or concerns.  cc Doreene Nest, NP    Return of visit: 6 months   Rickard Patience, MD, PhD Hematology Oncology [facility]  06/04/2021

## 2021-07-10 ENCOUNTER — Other Ambulatory Visit: Payer: Self-pay

## 2021-07-10 ENCOUNTER — Ambulatory Visit (INDEPENDENT_AMBULATORY_CARE_PROVIDER_SITE_OTHER): Payer: 59 | Admitting: Vascular Surgery

## 2021-07-10 ENCOUNTER — Encounter (INDEPENDENT_AMBULATORY_CARE_PROVIDER_SITE_OTHER): Payer: Self-pay | Admitting: Vascular Surgery

## 2021-07-10 VITALS — BP 153/88 | HR 54 | Resp 12 | Ht 73.0 in | Wt 220.0 lb

## 2021-07-10 DIAGNOSIS — I2782 Chronic pulmonary embolism: Secondary | ICD-10-CM

## 2021-07-10 DIAGNOSIS — D6851 Activated protein C resistance: Secondary | ICD-10-CM | POA: Diagnosis not present

## 2021-07-10 DIAGNOSIS — I87001 Postthrombotic syndrome without complications of right lower extremity: Secondary | ICD-10-CM | POA: Diagnosis not present

## 2021-07-10 DIAGNOSIS — R6889 Other general symptoms and signs: Secondary | ICD-10-CM | POA: Insufficient documentation

## 2021-07-10 NOTE — Assessment & Plan Note (Signed)
The patient has pain and swelling in the right lower extremity with a history of an extensive right lower extremity DVT.  This is likely postphlebitic syndrome of the right lower extremity, but he has not had an ultrasound in over a year.  I think would be reasonable to check an ultrasound to #1 ensure that there is no new thrombotic issue and number to evaluate for superficial venous reflux as well.  We have written a prescription for a new stockings and said that the one he has may be poorly fitting.  He may benefit from a lesser degree of compression.  He should continue to elevate his legs.  His job cause for him to sit or stand for long periods of time which makes things significantly worse, but he understands the hazard of his job.  He will return with his ultrasound in the next few weeks.  He may also have a component of lymphedema now from chronic scarring and lymphatic channels.  Depending on the results of his venous study, we may consider a lymphedema pump.

## 2021-07-10 NOTE — Assessment & Plan Note (Signed)
Status post thrombectomy over a year ago.  On anticoagulation as he has a hypercoagulable state.

## 2021-07-10 NOTE — Assessment & Plan Note (Signed)
Follows with hematology.  Still on Eliquis.

## 2021-07-10 NOTE — Progress Notes (Signed)
MRN : [id number]  Eric Bolton is a 50 y.o. ([birth year]) male who presents with chief complaint of  Chief Complaint  Patient presents with   Follow-up    History of DVT and aorta disorder  .  History of Present Illness: Patient returns today in follow up of his previous pulmonary embolus and right lower extremity DVT.  He underwent thrombectomy for both his pulmonary embolus as well as his DVT a little over a year ago.  He has factor V Leiden and remains on anticoagulation under the direction of hematology.  He has had no further thrombotic events to his knowledge.  In terms of chest pain and shortness of breath, he is symptomatically well.  He says he may have an occasional twinge of pain in his chest but nothing significant.  He is exercising well and breathing normally. His biggest complaint today is of some right leg pain and swelling.  The pain is predominantly on the medial aspect of the right lower leg.  The swelling progresses throughout the day.  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  He has been trying to wear compression socks, but it does not sound like they are particularly well fitting and he says they actually hurt worse.  No left leg symptoms.  The swelling has been gradually worsening over many months.  Current Outpatient Medications  Medication Sig Dispense Refill   apixaban (ELIQUIS) 2.5 MG TABS tablet Take 1 tablet (2.5 mg total) by mouth 2 (two) times daily. 180 tablet 1   No current facility-administered medications for this visit.    Past Medical History:  Diagnosis Date   DVT (deep venous thrombosis) ([facility]) 2020   right LE   Embolus ([facility]) 12/2019   Pulmonary embolus ([facility]) 04/2020    Past Surgical History:  Procedure Laterality Date   HERNIA REPAIR  2009   PULMONARY THROMBECTOMY N/A 04/20/2020   Procedure: PULMONARY THROMBECTOMY / THROMBOLYSIS POSSIBLE RIGHT LOWER EXTREMITY THROMBECTOMY / THROMBOLYSIS / POSSIBLE IVC  FILTER;  Surgeon: Algernon Huxley, MD;  Location: [facility];  Service: Cardiovascular;  Laterality: N/A;   VASECTOMY  1999     Social History   Tobacco Use   Smoking status: Never   Smokeless tobacco: Never  Vaping Use   Vaping Use: Never used  Substance Use Topics   Alcohol use: Yes    Comment: twice a week, on the weekends   Drug use: No       Family History  Problem Relation Age of Onset   Early death Mother        car accident   Factor V Leiden deficiency Father    Pulmonary embolism Father    Factor V Leiden deficiency Brother     No Known Allergies   REVIEW OF SYSTEMS (Negative unless checked)  Constitutional: [] Weight loss  [] Fever  [] Chills Cardiac: [] Chest pain   [] Chest pressure   [] Palpitations   [] Shortness of breath when laying flat   [] Shortness of breath at rest   [] Shortness of breath with exertion. Vascular:  [x] Pain in legs with walking   [x] Pain in legs at rest   [] Pain in legs when laying flat   [] Claudication   [] Pain in feet when walking  [] Pain in feet at rest  [] Pain in feet when laying flat   [x] History of DVT   [] Phlebitis   [x] Swelling in legs   [] Varicose veins   []   Non-healing ulcers Pulmonary:   [] Uses home oxygen   [] Productive cough   [] Hemoptysis   [] Wheeze  [] COPD   [] Asthma Neurologic:  [] Dizziness  [] Blackouts   [] Seizures   [] History of stroke   [] History of TIA  [] Aphasia   [] Temporary blindness   [] Dysphagia   [] Weakness or numbness in arms   [] Weakness or numbness in legs Musculoskeletal:  [] Arthritis   [] Joint swelling   [] Joint pain   [] Low back pain Hematologic:  [] Easy bruising  [] Easy bleeding   [x] Hypercoagulable state   [] Anemic   Gastrointestinal:  [] Blood in stool   [] Vomiting blood  [] Gastroesophageal reflux/heartburn   [] Abdominal pain Genitourinary:  [] Chronic kidney disease   [] Difficult urination  [] Frequent urination  [] Burning with urination   [] Hematuria Skin:  [] Rashes   [] Ulcers   [] Wounds Psychological:   [] History of anxiety   []  History of major depression.  Physical Examination  BP (!) 153/88 (BP Location: Left Arm)   Pulse (!) 54   Resp 12   Ht 6\' 1"  (1.854 m)   Wt 220 lb (99.8 kg)   BMI 29.03 kg/m  Gen:  WD/WN, NAD Head: [place]/AT, No temporalis wasting. Ear/Nose/Throat: Hearing grossly intact, nares w/o erythema or drainage Eyes: Conjunctiva clear. Sclera non-icteric Neck: Supple.  Trachea midline Pulmonary:  Good air movement, no use of accessory muscles.  Cardiac: RRR, no JVD Vascular:  Vessel Right Left  Radial Palpable Palpable                          PT Palpable Palpable  DP Palpable Palpable   Gastrointestinal: soft, non-tender/non-distended. No guarding/reflex.  Musculoskeletal: M/S 5/5 throughout.  No deformity or atrophy. 1+ RLE edema. Mild stasis dermatitis.  Neurologic: Sensation grossly intact in extremities.  Symmetrical.  Speech is fluent.  Psychiatric: Judgment intact, Mood & affect appropriate for pt's clinical situation. Dermatologic: No rashes or ulcers noted.  No cellulitis or open wounds.      Labs Recent Results (from the past 2160 hour(s))  CBC with Differential     Status: Abnormal   Collection Time: 06/04/21  9:39 AM  Result Value Ref Range   WBC 6.6 4.0 - 10.5 K/uL   RBC 4.56 4.22 - 5.81 MIL/uL   Hemoglobin 14.6 13.0 - 17.0 g/dL   HCT 40.3 39.0 - 52.0 %   MCV 88.4 80.0 - 100.0 fL   MCH 32.0 26.0 - 34.0 pg   MCHC 36.2 (H) 30.0 - 36.0 g/dL   RDW 12.2 11.5 - 15.5 %   Platelets 186 150 - 400 K/uL   nRBC 0.0 0.0 - 0.2 %   Neutrophils Relative % 51 %   Neutro Abs 3.4 1.7 - 7.7 K/uL   Lymphocytes Relative 38 %   Lymphs Abs 2.5 0.7 - 4.0 K/uL   Monocytes Relative 9 %   Monocytes Absolute 0.6 0.1 - 1.0 K/uL   Eosinophils Relative 1 %   Eosinophils Absolute 0.0 0.0 - 0.5 K/uL   Basophils Relative 1 %   Basophils Absolute 0.0 0.0 - 0.1 K/uL   Immature Granulocytes 0 %   Abs Immature Granulocytes 0.02 0.00 - 0.07 K/uL    Comment:  Performed at [facility], [address] [place], [place] [address]  Comprehensive metabolic panel     Status: Abnormal   Collection Time: 06/04/21  9:39 AM  Result Value Ref Range   Sodium 135 135 - 145 mmol/L   Potassium 4.0 3.5 -  5.1 mmol/L   Chloride 102 98 - 111 mmol/L   CO2 28 22 - 32 mmol/L   Glucose, Bld 109 (H) 70 - 99 mg/dL    Comment: Glucose reference range applies only to samples taken after fasting for at least 8 hours.   BUN 13 6 - 20 mg/dL   Creatinine, Ser 0.17 0.61 - 1.24 mg/dL   Calcium 9.3 8.9 - 51.0 mg/dL   Total Protein 7.6 6.5 - 8.1 g/dL   Albumin 4.6 3.5 - 5.0 g/dL   AST 41 15 - 41 U/L   ALT 62 (H) 0 - 44 U/L   Alkaline Phosphatase 47 38 - 126 U/L   Total Bilirubin 0.8 0.3 - 1.2 mg/dL   GFR, Estimated >25 >85 mL/min    Comment: (NOTE) Calculated using the CKD-EPI Creatinine Equation (2021)    Anion gap 5 5 - 15    Comment: Performed at [facility], [address], [place], [place] [address]    Radiology No results found.  Assessment/Plan  Post-thrombotic syndrome of right lower extremity The patient has pain and swelling in the right lower extremity with a history of an extensive right lower extremity DVT.  This is likely postphlebitic syndrome of the right lower extremity, but he has not had an ultrasound in over a year.  I think would be reasonable to check an ultrasound to #1 ensure that there is no new thrombotic issue and number to evaluate for superficial venous reflux as well.  We have written a prescription for a new stockings and said that the one he has may be poorly fitting.  He may benefit from a lesser degree of compression.  He should continue to elevate his legs.  His job cause for him to sit or stand for long periods of time which makes things significantly worse, but he understands the hazard of his job.  He will return with his ultrasound in the next few weeks.  He may also have a component of lymphedema now from chronic  scarring and lymphatic channels.  Depending on the results of his venous study, we may consider a lymphedema pump.  Pulmonary embolus ([facility]) Status post thrombectomy over a year ago.  On anticoagulation as he has a hypercoagulable state.  Heterozygous factor V Leiden mutation [facility]) Follows with hematology.  Still on Eliquis.    Festus Barren, MD  07/10/2021 9:25 AM    This note was created with Dragon medical transcription system.  Any errors from dictation are purely unintentional

## 2021-07-12 ENCOUNTER — Ambulatory Visit (INDEPENDENT_AMBULATORY_CARE_PROVIDER_SITE_OTHER): Payer: 59 | Admitting: Nurse Practitioner

## 2021-07-12 ENCOUNTER — Ambulatory Visit (INDEPENDENT_AMBULATORY_CARE_PROVIDER_SITE_OTHER): Payer: 59

## 2021-07-12 ENCOUNTER — Other Ambulatory Visit: Payer: Self-pay

## 2021-07-12 VITALS — BP 119/80 | HR 55 | Ht 73.0 in | Wt 220.0 lb

## 2021-07-12 DIAGNOSIS — I87001 Postthrombotic syndrome without complications of right lower extremity: Secondary | ICD-10-CM

## 2021-07-12 DIAGNOSIS — M79671 Pain in right foot: Secondary | ICD-10-CM | POA: Diagnosis not present

## 2021-07-23 ENCOUNTER — Encounter (INDEPENDENT_AMBULATORY_CARE_PROVIDER_SITE_OTHER): Payer: Self-pay | Admitting: Nurse Practitioner

## 2021-07-23 NOTE — Progress Notes (Signed)
Subjective:    Patient ID: Eric Bolton, male    DOB: 15-Aug-1970, 50 y.o.   MRN: [id number] Chief Complaint  Patient presents with   Follow-up    1 week R LE reflux     Patient returns today in follow up of his previous pulmonary embolus and right lower extremity DVT.  He underwent thrombectomy for both his pulmonary embolus as well as his DVT a little over a year ago.  He has factor V Leiden and remains on anticoagulation under the direction of hematology.  He has had no further thrombotic events to his knowledge.  In terms of chest pain and shortness of breath, he is symptomatically well.  He says he may have an occasional twinge of pain in his chest but nothing significant.  He is exercising well and breathing normally. His biggest complaint today is of some right leg pain and swelling.  The pain is predominantly on the medial aspect of the right lower leg.  The swelling progresses throughout the day.  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  He has been trying to wear compression socks, but it does not sound like they are particularly well fitting and he says they actually hurt worse.  No left leg symptoms.  The swelling has been gradually worsening over many months.  Since last office visit he has been wearing some compression and is somewhat helpful.  He also endorses having pain near the heel of his right foot.  The patient notes that he previously had shots in his heel and increasing pain.  He believes the steroid shots.  Today noninvasive studies show evidence deep venous insufficiency throughout the right lower extremity with superficial venous reflux in the great saphenous vein at the saphenofemoral junction.  There is no evidence of new DVT or superficial thrombophlebitis.   Review of Systems  Cardiovascular:  Positive for leg swelling.  Musculoskeletal:  Positive for gait problem and myalgias.  All other systems reviewed and are  negative.     Objective:   Physical Exam Vitals reviewed.  HENT:     Head: Normocephalic.  Cardiovascular:     Rate and Rhythm: Normal rate.     Pulses: Normal pulses.  Pulmonary:     Effort: Pulmonary effort is normal.  Musculoskeletal:     Right lower leg: 2+ Edema present.  Skin:    General: Skin is warm and dry.  Neurological:     Mental Status: He is alert and oriented to person, place, and time.  Psychiatric:        Mood and Affect: Mood normal.        Behavior: Behavior normal.        Thought Content: Thought content normal.        Judgment: Judgment normal.    BP 119/80   Pulse (!) 55   Ht 6\' 1"  (1.854 m)   Wt 220 lb (99.8 kg)   BMI 29.03 kg/m   Past Medical History:  Diagnosis Date   DVT (deep venous thrombosis) ([facility]) 2020   right LE   Embolus ([facility]) 12/2019   Pulmonary embolus ([facility]) 04/2020    Social History   Socioeconomic History   Marital status: Married    Spouse name: Dene   Number of children: 3   Years of education: Not on file   Highest education level: Not on file  Occupational History   Not on  file  Tobacco Use   Smoking status: Never   Smokeless tobacco: Never  Vaping Use   Vaping Use: Never used  Substance and Sexual Activity   Alcohol use: Yes    Comment: twice a week, on the weekends   Drug use: No   Sexual activity: Yes    Birth control/protection: Surgical  Other Topics Concern   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      Family: 3 adult sons - Duwayne Hecksaiah, Gerilyn PilgrimJacob, Carmelina NounOctavus and one grandson      Enjoys: watch and playing sports      Exercise: weight training, biking at work, running   Diet: pretty good      Safety   Seat belts: Yes    Guns: Yes  and secure   Safe in relationships: Yes       Social Determinants of Corporate investment bankerHealth   Financial Resource Strain: Not on file  Food Insecurity: Not on file  Transportation  Needs: Not on file  Physical Activity: Not on file  Stress: Not on file  Social Connections: Not on file  Intimate Partner Violence: Not on file    Past Surgical History:  Procedure Laterality Date   HERNIA REPAIR  2009   PULMONARY THROMBECTOMY N/A 04/20/2020   Procedure: PULMONARY THROMBECTOMY / THROMBOLYSIS POSSIBLE RIGHT LOWER EXTREMITY THROMBECTOMY / THROMBOLYSIS / POSSIBLE IVC FILTER;  Surgeon: Annice Needyew, Jason S, MD;  Location: [facility];  Service: Cardiovascular;  Laterality: N/A;   VASECTOMY  1999    Family History  Problem Relation Age of Onset   Early death Mother        car accident   Factor V Leiden deficiency Father    Pulmonary embolism Father    Factor V Leiden deficiency Brother     No Known Allergies  CBC Latest Ref Rng & Units 06/04/2021 11/29/2020 07/17/2020  WBC 4.0 - 10.5 K/uL 6.6 6.6 7.0  Hemoglobin 13.0 - 17.0 g/dL 16.114.6 09.615.7 04.514.4  Hematocrit 39.0 - 52.0 % 40.3 43.6 40.5  Platelets 150 - 400 K/uL 186 190 174      CMP     Component Value Date/Time   NA 135 06/04/2021 0939   K 4.0 06/04/2021 0939   CL 102 06/04/2021 0939   CO2 28 06/04/2021 0939   GLUCOSE 109 (H) 06/04/2021 0939   BUN 13 06/04/2021 0939   CREATININE 1.22 06/04/2021 0939   CALCIUM 9.3 06/04/2021 0939   PROT 7.6 06/04/2021 0939   ALBUMIN 4.6 06/04/2021 0939   AST 41 06/04/2021 0939   ALT 62 (H) 06/04/2021 0939   ALKPHOS 47 06/04/2021 0939   BILITOT 0.8 06/04/2021 0939   GFRNONAA >60 06/04/2021 0939   GFRAA >60 04/18/2020 0407     No results found.     Assessment & Plan:   1. Post-thrombotic syndrome of right lower extremity I have had a long discussion with the patient regarding swelling and why it  causes symptoms.  Patient will begin wearing graduated compression stockings class 1 (20-30 mmHg) on a daily basis a prescription was given. The patient will  beginning wearing the stockings first thing in the morning and removing them in the evening. The patient is  instructed specifically not to sleep in the stockings.   In addition, behavioral modification will be initiated.  This will include frequent elevation, use of over  the counter pain medications and exercise such as walking.  I have reviewed systemic causes for chronic edema such as liver, kidney and cardiac etiologies.  The patient denies problems with these organ systems.    Consideration for a lymph pump will also be made based upon the effectiveness of conservative therapy.  This would help to improve the edema control and prevent sequela such as ulcers and infections   Patient should undergo duplex ultrasound of the venous system to ensure that DVT or reflux is not present.  The patient will follow-up with me after the ultrasound.    2. Right foot pain I suspect that the patient is describing Planter fasciitis.  We will see the patient to podiatry for evaluation.  The patient notes that he has seen podiatry previously but its been years since that is happening. - Ambulatory referral to Podiatry   Current Outpatient Medications on File Prior to Visit  Medication Sig Dispense Refill   apixaban (ELIQUIS) 2.5 MG TABS tablet Take 1 tablet (2.5 mg total) by mouth 2 (two) times daily. 180 tablet 1   No current facility-administered medications on file prior to visit.    There are no Patient Instructions on file for this visit. No follow-ups on file.   Georgiana Spinner, NP

## 2021-12-02 ENCOUNTER — Other Ambulatory Visit: Payer: Self-pay | Admitting: Oncology

## 2021-12-03 ENCOUNTER — Inpatient Hospital Stay: Payer: 59 | Admitting: Oncology

## 2021-12-03 ENCOUNTER — Inpatient Hospital Stay: Payer: 59

## 2021-12-06 ENCOUNTER — Telehealth: Payer: Self-pay

## 2021-12-06 NOTE — Telephone Encounter (Signed)
-----   Message from Reggy Eye sent at 12/05/2021  1:45 PM EDT ----- ?Regarding: medication ?Contact: [phone] ?Patient called and said he is on Eliquis 2.5 mg. The pharmacy needs a new refill. He would like 3 or 6 mos supply, [facility] [address] ? ?

## 2021-12-06 NOTE — Telephone Encounter (Signed)
Spoke to patient and verified that he has enough medication until next week's visit on 5/4. Medication will be refilled at visit.  ?

## 2021-12-09 ENCOUNTER — Other Ambulatory Visit: Payer: Self-pay | Admitting: Oncology

## 2021-12-10 ENCOUNTER — Telehealth: Payer: Self-pay | Admitting: *Deleted

## 2021-12-10 NOTE — Telephone Encounter (Signed)
Patient called stating he has a scheduling conflict and needs to reschedule his appointment  ?

## 2021-12-13 ENCOUNTER — Inpatient Hospital Stay: Payer: 59

## 2021-12-13 ENCOUNTER — Inpatient Hospital Stay: Payer: 59 | Admitting: Oncology

## 2022-01-01 ENCOUNTER — Other Ambulatory Visit: Payer: Self-pay

## 2022-01-01 DIAGNOSIS — Z86718 Personal history of other venous thrombosis and embolism: Secondary | ICD-10-CM

## 2022-01-02 ENCOUNTER — Inpatient Hospital Stay: Payer: 59 | Attending: Oncology | Admitting: Oncology

## 2022-01-02 ENCOUNTER — Inpatient Hospital Stay: Payer: 59

## 2022-01-02 ENCOUNTER — Encounter: Payer: Self-pay | Admitting: Oncology

## 2022-01-02 VITALS — BP 120/83 | HR 58 | Temp 96.4°F | Ht 73.0 in | Wt 204.0 lb

## 2022-01-02 DIAGNOSIS — I87001 Postthrombotic syndrome without complications of right lower extremity: Secondary | ICD-10-CM | POA: Diagnosis not present

## 2022-01-02 DIAGNOSIS — Z86718 Personal history of other venous thrombosis and embolism: Secondary | ICD-10-CM

## 2022-01-02 DIAGNOSIS — Z7901 Long term (current) use of anticoagulants: Secondary | ICD-10-CM | POA: Insufficient documentation

## 2022-01-02 DIAGNOSIS — R600 Localized edema: Secondary | ICD-10-CM | POA: Insufficient documentation

## 2022-01-02 DIAGNOSIS — D6851 Activated protein C resistance: Secondary | ICD-10-CM | POA: Diagnosis not present

## 2022-01-02 DIAGNOSIS — Z86711 Personal history of pulmonary embolism: Secondary | ICD-10-CM

## 2022-01-02 LAB — CBC WITH DIFFERENTIAL/PLATELET
Abs Immature Granulocytes: 0.01 10*3/uL (ref 0.00–0.07)
Basophils Absolute: 0 10*3/uL (ref 0.0–0.1)
Basophils Relative: 0 %
Eosinophils Absolute: 0 10*3/uL (ref 0.0–0.5)
Eosinophils Relative: 1 %
HCT: 42.3 % (ref 39.0–52.0)
Hemoglobin: 14.7 g/dL (ref 13.0–17.0)
Immature Granulocytes: 0 %
Lymphocytes Relative: 38 %
Lymphs Abs: 2.2 10*3/uL (ref 0.7–4.0)
MCH: 31.8 pg (ref 26.0–34.0)
MCHC: 34.8 g/dL (ref 30.0–36.0)
MCV: 91.6 fL (ref 80.0–100.0)
Monocytes Absolute: 0.6 10*3/uL (ref 0.1–1.0)
Monocytes Relative: 10 %
Neutro Abs: 2.9 10*3/uL (ref 1.7–7.7)
Neutrophils Relative %: 51 %
Platelets: 180 10*3/uL (ref 150–400)
RBC: 4.62 MIL/uL (ref 4.22–5.81)
RDW: 12.2 % (ref 11.5–15.5)
WBC: 5.8 10*3/uL (ref 4.0–10.5)
nRBC: 0 % (ref 0.0–0.2)

## 2022-01-02 LAB — COMPREHENSIVE METABOLIC PANEL
ALT: 34 U/L (ref 0–44)
AST: 34 U/L (ref 15–41)
Albumin: 4.6 g/dL (ref 3.5–5.0)
Alkaline Phosphatase: 58 U/L (ref 38–126)
Anion gap: 7 (ref 5–15)
BUN: 17 mg/dL (ref 6–20)
CO2: 26 mmol/L (ref 22–32)
Calcium: 9.3 mg/dL (ref 8.9–10.3)
Chloride: 105 mmol/L (ref 98–111)
Creatinine, Ser: 1.62 mg/dL — ABNORMAL HIGH (ref 0.61–1.24)
GFR, Estimated: 52 mL/min — ABNORMAL LOW (ref >60)
Glucose, Bld: 92 mg/dL (ref 70–99)
Potassium: 4.4 mmol/L (ref 3.5–5.1)
Sodium: 138 mmol/L (ref 135–145)
Total Bilirubin: 1.1 mg/dL (ref 0.3–1.2)
Total Protein: 7.6 g/dL (ref 6.5–8.1)

## 2022-01-02 NOTE — Progress Notes (Signed)
Hematology/Oncology Progress note [phone] [phone]      Patient Care Team: Doreene Nest, NP as PCP - General (Internal Medicine)  REFERRING PROVIDER: Doreene Nest, NP  CHIEF COMPLAINTS/REASON FOR VISIT:  Follow up for acute lower extremity DVT and pulmonary embolism  HISTORY OF PRESENTING ILLNESS:   Eric Bolton is a  51 y.o.  male with PMH listed below was seen in consultation at the request of  Doreene Nest, NP  for evaluation of acute lower extremity DVT  12/10/2019 lower extremity venous ultrasound showed acute deep vein thrombosis involving the right popliteal vein, also age indeterminate DVT involving the right peroneal veins, right posterior tibial veins.  No DVT of left lower extremity.  Patient reports that patient noticed right lower extremity calf pain/ankle swelling 8 to 10 months prior to this ultrasound. He was in his usual state of health and discovered acute onset of right lower extremity pain and swelling.  She was treated for gout flare.  She was seen by primary care provider on 05/17/2019.  At that time the swelling persists, and was attributed to gout, lower extremity vein insufficiency.  Was recommended to use compression stocking.  The swelling of right lower extremity persist and he talked [place] physician [place] and the physician recommended him to obtain ultrasound of lower extremity to rule out DVT. Ultrasound in April 2021 is positive for acute DVT and patient was started on Eliquis 10 mg twice daily for a week followed by Eliquis 5 mg twice daily.  Patient reports that lower extremity swelling has improved although he continues to have some edema at the end of the day.  No calf pain.  Patient denies any shortness of breath, chest pain. He tolerates anticoagulation.  No bleeding symptoms. Patient was referred to establish care with hematology for further evaluation and management.  #  Patient was last seen by me in June 2021 and was recommended to complete 3 to 6 months of anticoagulation and switch to long-term anticoagulation prophylaxis.  Patient has factor V Leiden heterozygous state. Patient had an appointment with me in August 2021 .  He was not able to make it and also run out of Eliquis.  Patient also had a long distance car drive and after that travel he developed right lower extremity pain and swelling.  He also experienced mild shortness of breath with exertion.  Patient went to emergency room on 04/17/2020 after the onset of the symptoms and was found to have extensive occlusive DVT throughout the right lower extremity extending from the common femoral vein into the calf.  CT also showed bilateral central pulmonary emboli with the most central embolus within the left main pulmonary artery.  The thrombus becomes nearly occlusive at the bifurcation of the lingula and the left lower lobe arteries.  Near occlusion pulmonary embolus in the distal right main pulmonary artery extending to the lobar branches of the upper and lower lobes.  Right heart strain.  Patient was started on heparin drip in the hospital.  04/20/2020, patient had catheter directed thrombolysis to the right popliteal and femoral veins, mechanical thrombectomy to the right popliteal, superficial femoral, common femoral veins.  He also had catheter directed thrombolysis and mechanical embolectomy left upper lobe and the left lobe pulmonary arteries.  He was also switched to Eliquis  BID x 7 days followed by Eliquis  BID.  04/17/2020 started Eliquis anticoagulation.  INTERVAL HISTORY Eric Bolton is a 51  y.o. male who has above history reviewed by me today presents for follow up visit for management of DVT and PE. Problems and complaints are listed below: Patient is on Eliquis 2.5 mg twice daily.   During the interval, patient has been seen by vascular surgery in November 2022. Patient reports feeling well.   He applies compression stocking.  Lower extremity swelling is mild, manageable.  Denies any acute bleeding events.   Review of Systems  Constitutional:  Negative for appetite change, chills, fatigue, fever and unexpected weight change.  HENT:   Negative for hearing loss and voice change.   Eyes:  Negative for eye problems and icterus.  Respiratory:  Negative for chest tightness, cough and shortness of breath.   Cardiovascular:  Negative for chest pain and leg swelling.  Gastrointestinal:  Negative for abdominal distention and abdominal pain.  Endocrine: Negative for hot flashes.  Genitourinary:  Negative for difficulty urinating, dysuria and frequency.   Musculoskeletal:  Negative for arthralgias.       RLE swelling   Skin:  Negative for itching and rash.  Neurological:  Negative for light-headedness and numbness.  Hematological:  Negative for adenopathy. Does not bruise/bleed easily.  Psychiatric/Behavioral:  Negative for confusion.    MEDICAL HISTORY:  Past Medical History:  Diagnosis Date   DVT (deep venous thrombosis) (HCC) 2020   right LE   Embolus (HCC) 12/2019   Pulmonary embolus (HCC) 04/2020    SURGICAL HISTORY: Past Surgical History:  Procedure Laterality Date   HERNIA REPAIR  2009   PULMONARY THROMBECTOMY N/A 04/20/2020   Procedure: PULMONARY THROMBECTOMY / THROMBOLYSIS POSSIBLE RIGHT LOWER EXTREMITY THROMBECTOMY / THROMBOLYSIS / POSSIBLE IVC FILTER;  Surgeon: Annice Needy, MD;  Location: [facility] INVASIVE CV LAB;  Service: Cardiovascular;  Laterality: N/A;   VASECTOMY  1999    SOCIAL HISTORY: Social History   Socioeconomic History   Marital status: Married    Spouse name: Dene   Number of children: 3   Years of education: Not on file   Highest education level: Not on file  Occupational History   Not on file  Tobacco Use   Smoking status: Never   Smokeless tobacco: Never  Vaping Use   Vaping Use: Never used  Substance and Sexual Activity   Alcohol use: Yes     Comment: twice a week, on the weekends   Drug use: No   Sexual activity: Yes    Birth control/protection: Surgical  Other Topics Concern   Not on file  Social History Narrative   06/20/20   From: [place]    Living: with wife, Dene (1995)   Work: Copywriter, advertising      Family: 3 adult sons - Duwayne Heck, Gerilyn Pilgrim, Carmelina Noun and one grandson      Enjoys: watch and playing sports      Exercise: weight training, biking at work, running   Diet: pretty good      Safety   Seat belts: Yes    Guns: Yes  and secure   Safe in relationships: Yes       Social Determinants of Corporate investment banker Strain: Not on file  Food Insecurity: Not on file  Transportation Needs: Not on file  Physical Activity: Not on file  Stress: Not on file  Social Connections: Not on file  Intimate Partner Violence: Not on file    FAMILY HISTORY: Family History  Problem Relation Age of Onset   Early death Mother  car accident   Factor V Leiden deficiency Father    Pulmonary embolism Father    Factor V Leiden deficiency Brother     ALLERGIES:  has No Known Allergies.  MEDICATIONS:  Current Outpatient Medications  Medication Sig Dispense Refill   ELIQUIS 2.5 MG TABS tablet TAKE 1 TABLET BY MOUTH TWICE A DAY 180 tablet 1   No current facility-administered medications for this visit.     PHYSICAL EXAMINATION: ECOG PERFORMANCE STATUS: 0 - Asymptomatic Vitals:   01/02/22 1006  BP: 120/83  Pulse: (!) 58  Temp: (!) 96.4 F (35.8 C)   Filed Weights   01/02/22 1006  Weight: 204 lb (92.5 kg)    Physical Exam Constitutional:      General: He is not in acute distress. HENT:     Head: Normocephalic and atraumatic.  Eyes:     General: No scleral icterus. Cardiovascular:     Rate and Rhythm: Normal rate and regular rhythm.     Heart sounds: Normal heart sounds.  Pulmonary:     Effort: Pulmonary effort is normal. No respiratory distress.     Breath sounds: No  wheezing.  Abdominal:     General: Bowel sounds are normal. There is no distension.     Palpations: Abdomen is soft.  Musculoskeletal:        General: No deformity. Normal range of motion.     Cervical back: Normal range of motion and neck supple.     Comments: Right lower extremity trace edema  Skin:    General: Skin is warm and dry.     Findings: No erythema or rash.  Neurological:     Mental Status: He is alert and oriented to person, place, and time. Mental status is at baseline.     Cranial Nerves: No cranial nerve deficit.     Coordination: Coordination normal.  Psychiatric:        Mood and Affect: Mood normal.    LABORATORY DATA:  I have reviewed the data as listed Lab Results  Component Value Date   WBC 5.8 01/02/2022   HGB 14.7 01/02/2022   HCT 42.3 01/02/2022   MCV 91.6 01/02/2022   PLT 180 01/02/2022   Recent Labs    06/04/21 0939 01/02/22 0956  NA 135 138  K 4.0 4.4  CL 102 105  CO2 28 26  GLUCOSE 109* 92  BUN 13 17  CREATININE 1.22 1.62*  CALCIUM 9.3 9.3  GFRNONAA >60 52*  PROT 7.6 7.6  ALBUMIN 4.6 4.6  AST 41 34  ALT 62* 34  ALKPHOS 47 58  BILITOT 0.8 1.1    Iron/TIBC/Ferritin/ %Sat No results found for: IRON, TIBC, FERRITIN, IRONPCTSAT    RADIOGRAPHIC STUDIES: I have personally reviewed the radiological images as listed and agreed with the findings in the report. No results found.    ASSESSMENT & PLAN:  1. History of DVT (deep vein thrombosis)   2. History of pulmonary embolism   3. Heterozygous factor V Leiden mutation (HCC)   4. Post-thrombotic syndrome of right lower extremity    Recurrent acute RLE DVT and acute PE Heterozygous Factor V Leiden mutation Labs reviewed and discussed patient. Proceed with Eliquis 2.5 mg twice daily.  Post thrombotic syndrome of the right lower extremity.  Symptoms are stable.  Recommend patient to continue utilize compression stocking.  All questions were answered. The patient knows to call the  clinic with any problems questions or concerns.  cc Doreene Nest, NP  Return of visit: 6 months   Rickard PatienceZhou Freja Faro, MD, PhD Hematology Oncology 01/02/2022

## 2022-01-11 ENCOUNTER — Ambulatory Visit (INDEPENDENT_AMBULATORY_CARE_PROVIDER_SITE_OTHER): Payer: 59 | Admitting: Nurse Practitioner

## 2022-05-15 ENCOUNTER — Other Ambulatory Visit: Payer: Self-pay | Admitting: Oncology

## 2022-07-09 ENCOUNTER — Inpatient Hospital Stay (HOSPITAL_BASED_OUTPATIENT_CLINIC_OR_DEPARTMENT_OTHER): Payer: No Typology Code available for payment source | Admitting: Oncology

## 2022-07-09 ENCOUNTER — Inpatient Hospital Stay: Payer: No Typology Code available for payment source | Attending: Oncology

## 2022-07-09 ENCOUNTER — Encounter: Payer: Self-pay | Admitting: Oncology

## 2022-07-09 VITALS — BP 116/87 | HR 56 | Temp 96.0°F | Wt 217.0 lb

## 2022-07-09 DIAGNOSIS — Z7901 Long term (current) use of anticoagulants: Secondary | ICD-10-CM | POA: Diagnosis not present

## 2022-07-09 DIAGNOSIS — R7401 Elevation of levels of liver transaminase levels: Secondary | ICD-10-CM | POA: Insufficient documentation

## 2022-07-09 DIAGNOSIS — D6851 Activated protein C resistance: Secondary | ICD-10-CM

## 2022-07-09 DIAGNOSIS — Z86711 Personal history of pulmonary embolism: Secondary | ICD-10-CM

## 2022-07-09 DIAGNOSIS — Z86718 Personal history of other venous thrombosis and embolism: Secondary | ICD-10-CM | POA: Diagnosis present

## 2022-07-09 DIAGNOSIS — I87001 Postthrombotic syndrome without complications of right lower extremity: Secondary | ICD-10-CM | POA: Insufficient documentation

## 2022-07-09 DIAGNOSIS — R600 Localized edema: Secondary | ICD-10-CM | POA: Insufficient documentation

## 2022-07-09 LAB — COMPREHENSIVE METABOLIC PANEL
ALT: 61 U/L — ABNORMAL HIGH (ref 0–44)
AST: 51 U/L — ABNORMAL HIGH (ref 15–41)
Albumin: 4.5 g/dL (ref 3.5–5.0)
Alkaline Phosphatase: 46 U/L (ref 38–126)
Anion gap: 6 (ref 5–15)
BUN: 16 mg/dL (ref 6–20)
CO2: 26 mmol/L (ref 22–32)
Calcium: 9.2 mg/dL (ref 8.9–10.3)
Chloride: 103 mmol/L (ref 98–111)
Creatinine, Ser: 1.33 mg/dL — ABNORMAL HIGH (ref 0.61–1.24)
GFR, Estimated: >60 mL/min (ref >60)
Glucose, Bld: 104 mg/dL — ABNORMAL HIGH (ref 70–99)
Potassium: 3.9 mmol/L (ref 3.5–5.1)
Sodium: 135 mmol/L (ref 135–145)
Total Bilirubin: 0.9 mg/dL (ref 0.3–1.2)
Total Protein: 7.6 g/dL (ref 6.5–8.1)

## 2022-07-09 LAB — CBC WITH DIFFERENTIAL/PLATELET
Abs Immature Granulocytes: 0.02 10*3/uL (ref 0.00–0.07)
Basophils Absolute: 0 10*3/uL (ref 0.0–0.1)
Basophils Relative: 1 %
Eosinophils Absolute: 0.1 10*3/uL (ref 0.0–0.5)
Eosinophils Relative: 1 %
HCT: 41.7 % (ref 39.0–52.0)
Hemoglobin: 14.7 g/dL (ref 13.0–17.0)
Immature Granulocytes: 0 %
Lymphocytes Relative: 46 %
Lymphs Abs: 2.8 10*3/uL (ref 0.7–4.0)
MCH: 32.2 pg (ref 26.0–34.0)
MCHC: 35.3 g/dL (ref 30.0–36.0)
MCV: 91.4 fL (ref 80.0–100.0)
Monocytes Absolute: 0.6 10*3/uL (ref 0.1–1.0)
Monocytes Relative: 11 %
Neutro Abs: 2.4 10*3/uL (ref 1.7–7.7)
Neutrophils Relative %: 41 %
Platelets: 175 10*3/uL (ref 150–400)
RBC: 4.56 MIL/uL (ref 4.22–5.81)
RDW: 12.7 % (ref 11.5–15.5)
WBC: 6 10*3/uL (ref 4.0–10.5)
nRBC: 0 % (ref 0.0–0.2)

## 2022-07-09 NOTE — Progress Notes (Addendum)
Hematology/Oncology Progress note [phone] [phone]      Patient Care Team: Doreene Nest, NP as PCP - General (Internal Medicine)  REFERRING PROVIDER: Doreene Nest, NP  CHIEF COMPLAINTS/REASON FOR VISIT:  Follow up for history of acute lower extremity DVT and pulmonary embolism  HISTORY OF PRESENTING ILLNESS:   Eric Bolton is a  51 y.o.  male with PMH listed below was seen in consultation at the request of  Doreene Nest, NP  for evaluation of acute lower extremity DVT  12/10/2019 lower extremity venous ultrasound showed acute deep vein thrombosis involving the right popliteal vein, also age indeterminate DVT involving the right peroneal veins, right posterior tibial veins.  No DVT of left lower extremity.  Patient reports that patient noticed right lower extremity calf pain/ankle swelling 8 to 10 months prior to this ultrasound. He was in his usual state of health and discovered acute onset of right lower extremity pain and swelling.  She was treated for gout flare.  She was seen by primary care provider on 05/17/2019.  At that time the swelling persists, and was attributed to gout, lower extremity vein insufficiency.  Was recommended to use compression stocking.  The swelling of right lower extremity persist and he talked to a physician at work [place] and the physician recommended him to obtain ultrasound of lower extremity to rule out DVT. Ultrasound in April 2021 is positive for acute DVT and patient was started on Eliquis 10 mg twice daily for a week followed by Eliquis 5 mg twice daily.  Patient reports that lower extremity swelling has improved although he continues to have some edema at the end of the day.  No calf pain.  Patient denies any shortness of breath, chest pain. He tolerates anticoagulation.  No bleeding symptoms. Patient was referred to establish care with hematology for further evaluation and  management.  # Patient was last seen by me in June 2021 and was recommended to complete 3 to 6 months of anticoagulation and switch to long-term anticoagulation prophylaxis.  Patient has factor V Leiden heterozygous state. Patient had an appointment with me in August 2021 .  He was not able to make it and also run out of Eliquis.  Patient also had a long distance car drive and after that travel he developed right lower extremity pain and swelling.  He also experienced mild shortness of breath with exertion.  Patient went to emergency room on 04/17/2020 after the onset of the symptoms and was found to have extensive occlusive DVT throughout the right lower extremity extending from the common femoral vein into the calf.  CT also showed bilateral central pulmonary emboli with the most central embolus within the left main pulmonary artery.  The thrombus becomes nearly occlusive at the bifurcation of the lingula and the left lower lobe arteries.  Near occlusion pulmonary embolus in the distal right main pulmonary artery extending to the lobar branches of the upper and lower lobes.  Right heart strain.  Patient was started on heparin drip in the hospital.  04/20/2020, patient had catheter directed thrombolysis to the right popliteal and femoral veins, mechanical thrombectomy to the right popliteal, superficial femoral, common femoral veins.  He also had catheter directed thrombolysis and mechanical embolectomy left upper lobe and the left lobe pulmonary arteries.  He was also switched to Eliquis 10mg  BID x 7 days followed by Eliquis 5mg  BID.  04/17/2020 started Eliquis anticoagulation.  INTERVAL HISTORY Eric Bolton is  a 51 y.o. male who has above history reviewed by me today presents for follow up visit for management of DVT and PE. Problems and complaints are listed below: Patient is on Eliquis 2.5 mg twice daily.   During the interval, patient has changed insurance and is in the process of switching to [place]  healthcare.  [facility] and per patient he had an ultrasound of right lower extremity done.  He was recommended to increase Eliquis to 5 mg twice daily.  Patient denies any lower extremity pain or swelling at this point.  He has not increased his Eliquis dose yet.  He has an appointment to establish care with [facility] hematology next week.   Review of Systems  Constitutional:  Negative for appetite change, chills, fatigue, fever and unexpected weight change.  HENT:   Negative for hearing loss and voice change.   Eyes:  Negative for eye problems and icterus.  Respiratory:  Negative for chest tightness, cough and shortness of breath.   Cardiovascular:  Negative for chest pain and leg swelling.  Gastrointestinal:  Negative for abdominal distention and abdominal pain.  Endocrine: Negative for hot flashes.  Genitourinary:  Negative for difficulty urinating, dysuria and frequency.   Musculoskeletal:  Negative for arthralgias.       RLE swelling   Skin:  Negative for itching and rash.  Neurological:  Negative for light-headedness and numbness.  Hematological:  Negative for adenopathy. Does not bruise/bleed easily.  Psychiatric/Behavioral:  Negative for confusion.     MEDICAL HISTORY:  Past Medical History:  Diagnosis Date   DVT (deep venous thrombosis) (HCC) 2020   right LE   Embolus (HCC) 12/2019   Pulmonary embolus (HCC) 04/2020    SURGICAL HISTORY: Past Surgical History:  Procedure Laterality Date   HERNIA REPAIR  2009   PULMONARY THROMBECTOMY N/A 04/20/2020   Procedure: PULMONARY THROMBECTOMY / THROMBOLYSIS POSSIBLE RIGHT LOWER EXTREMITY THROMBECTOMY / THROMBOLYSIS / POSSIBLE IVC FILTER;  Surgeon: Annice Needy, MD;  Location: [facility] INVASIVE CV LAB;  Service: Cardiovascular;  Laterality: N/A;   VASECTOMY  1999    SOCIAL HISTORY: Social History   Socioeconomic History   Marital status: Married    Spouse name: Dene   Number of children: 3   Years of education:  Not on file   Highest education level: Not on file  Occupational History   Not on file  Tobacco Use   Smoking status: Never   Smokeless tobacco: Never  Vaping Use   Vaping Use: Never used  Substance and Sexual Activity   Alcohol use: Yes    Comment: twice a week, on the weekends   Drug use: No   Sexual activity: Yes    Birth control/protection: Surgical  Other Topics Concern   Not on file  Social History Narrative   06/20/20   From: [place]    Living: with wife, Dene (1995)   Work: Copywriter, advertising      Family: 3 adult sons - Duwayne Heck, Gerilyn Pilgrim, Carmelina Noun and one grandson      Enjoys: watch and playing sports      Exercise: weight training, biking at work, running   Diet: pretty good      Safety   Seat belts: Yes    Guns: Yes  and secure   Safe in relationships: Yes       Social Determinants of Corporate investment banker Strain: Not on file  Food Insecurity: Not on  file  Transportation Needs: Not on file  Physical Activity: Not on file  Stress: Not on file  Social Connections: Not on file  Intimate Partner Violence: Not on file    FAMILY HISTORY: Family History  Problem Relation Age of Onset   Early death Mother        car accident   Factor V Leiden deficiency Father    Pulmonary embolism Father    Factor V Leiden deficiency Brother     ALLERGIES:  has No Known Allergies.  MEDICATIONS:  Current Outpatient Medications  Medication Sig Dispense Refill   ELIQUIS 2.5 MG TABS tablet TAKE 1 TABLET BY MOUTH TWICE A DAY 180 tablet 1   No current facility-administered medications for this visit.     PHYSICAL EXAMINATION: ECOG PERFORMANCE STATUS: 0 - Asymptomatic Vitals:   07/09/22 1036  BP: 116/87  Pulse: (!) 56  Temp: (!) 96 F (35.6 C)  SpO2: 98%   Filed Weights   07/09/22 1036  Weight: 217 lb (98.4 kg)    Physical Exam Constitutional:      General: He is not in acute distress. HENT:     Head: Normocephalic and atraumatic.   Eyes:     General: No scleral icterus. Cardiovascular:     Rate and Rhythm: Normal rate and regular rhythm.     Heart sounds: Normal heart sounds.  Pulmonary:     Effort: Pulmonary effort is normal. No respiratory distress.     Breath sounds: No wheezing.  Abdominal:     General: Bowel sounds are normal. There is no distension.     Palpations: Abdomen is soft.  Musculoskeletal:        General: No deformity. Normal range of motion.     Cervical back: Normal range of motion and neck supple.     Comments: Right lower extremity trace edema  Skin:    General: Skin is warm and dry.     Findings: No erythema or rash.  Neurological:     Mental Status: He is alert and oriented to person, place, and time. Mental status is at baseline.     Cranial Nerves: No cranial nerve deficit.     Coordination: Coordination normal.  Psychiatric:        Mood and Affect: Mood normal.     LABORATORY DATA:  I have reviewed the data as listed Lab Results  Component Value Date   WBC 6.0 07/09/2022   HGB 14.7 07/09/2022   HCT 41.7 07/09/2022   MCV 91.4 07/09/2022   PLT 175 07/09/2022   Recent Labs    01/02/22 0956 07/09/22 1023  NA 138 135  K 4.4 3.9  CL 105 103  CO2 26 26  GLUCOSE 92 104*  BUN 17 16  CREATININE 1.62* 1.33*  CALCIUM 9.3 9.2  GFRNONAA 52* >60  PROT 7.6 7.6  ALBUMIN 4.6 4.5  AST 34 51*  ALT 34 61*  ALKPHOS 58 46  BILITOT 1.1 0.9    Iron/TIBC/Ferritin/ %Sat No results found for: "IRON", "TIBC", "FERRITIN", "IRONPCTSAT"    RADIOGRAPHIC STUDIES: I have personally reviewed the radiological images as listed and agreed with the findings in the report. No results found.    ASSESSMENT & PLAN:  1. History of DVT (deep vein thrombosis)   2. History of pulmonary embolism   3. Heterozygous factor V Leiden mutation (HCC)    Recurrent acute RLE DVT and acute PE Heterozygous Factor V Leiden mutation Labs reviewed and discussed patient. He is on  Eliquis 2.5mg  BID for  prophylaxis.  [facility] not currently available to me. He has been recommended by his PCP to increase Eliquis to 5 mg twice daily based on the ultrasound findings. I recommend patient to keep his appointment with [facility] hemat[facility] next week and follow their recommendation.  Post thrombotic syndrome of the right lower extremity.  Symptoms are stable.  Recommend patient to continue utilize compression stocking.  Transaminitis, etiology unknown.  Possibly secondary to recent alcohol use during Thanksgiving holidays.  Recommend patient to avoid alcohol and have a follow-up liver function testing done with his [facility] providers in the future. All questions were answered. The patient knows to call the clinic with any problems questions or concerns.  cc Doreene Nestlark, Katherine K, NP   Patient will be discharged from hematology clinic today.   Rickard PatienceZhou Jillaine Waren, MD, PhD Hematology Oncology 07/09/2022

## 2023-04-01 ENCOUNTER — Telehealth: Payer: Self-pay | Admitting: Primary Care

## 2023-04-01 NOTE — Telephone Encounter (Signed)
Patient last seen in 2021- left voicemail for patient to see if he would like to continue care with the PCP
